# Patient Record
Sex: Female | Born: 1965 | Race: White | Hispanic: No | Marital: Married | Smoking: Former smoker
Health system: Southern US, Community
[De-identification: ages and names within clinical notes are randomized; demographics above are authoritative.]

## PROBLEM LIST (undated history)

## (undated) DIAGNOSIS — F329 Major depressive disorder, single episode, unspecified: Secondary | ICD-10-CM

## (undated) DIAGNOSIS — R51 Headache: Secondary | ICD-10-CM

## (undated) DIAGNOSIS — F419 Anxiety disorder, unspecified: Secondary | ICD-10-CM

## (undated) DIAGNOSIS — M5126 Other intervertebral disc displacement, lumbar region: Secondary | ICD-10-CM

## (undated) DIAGNOSIS — K219 Gastro-esophageal reflux disease without esophagitis: Secondary | ICD-10-CM

## (undated) DIAGNOSIS — F32A Depression, unspecified: Secondary | ICD-10-CM

## (undated) DIAGNOSIS — M1990 Unspecified osteoarthritis, unspecified site: Secondary | ICD-10-CM

## (undated) DIAGNOSIS — R519 Headache, unspecified: Secondary | ICD-10-CM

## (undated) HISTORY — PX: CHOLECYSTECTOMY: SHX55

## (undated) HISTORY — PX: PARTIAL HYSTERECTOMY: SHX80

## (undated) HISTORY — PX: OTHER SURGICAL HISTORY: SHX169

## (undated) HISTORY — PX: BREAST REDUCTION SURGERY: SHX8

## (undated) HISTORY — DX: Gastro-esophageal reflux disease without esophagitis: K21.9

---

## 2005-06-26 ENCOUNTER — Ambulatory Visit: Payer: Self-pay | Admitting: Internal Medicine

## 2005-07-05 ENCOUNTER — Ambulatory Visit (HOSPITAL_COMMUNITY): Admission: RE | Admit: 2005-07-05 | Discharge: 2005-07-05 | Payer: Self-pay | Admitting: Internal Medicine

## 2005-07-05 ENCOUNTER — Ambulatory Visit: Payer: Self-pay | Admitting: Internal Medicine

## 2005-07-08 ENCOUNTER — Ambulatory Visit (HOSPITAL_COMMUNITY): Admission: RE | Admit: 2005-07-08 | Discharge: 2005-07-08 | Payer: Self-pay | Admitting: Internal Medicine

## 2005-07-08 IMAGING — US US ABDOMEN COMPLETE
1 series · 14 of 25 positions shown · non-contrast
Comparison: none

CLINICAL DATA: Right upper quadrant pain.  GERD.  Epigastric pain and pressure.  Hiatal hernia.
 ABDOMEN ULTRASOUND COMPLETE:
TECHNIQUE: Complete abdominal ultrasound examination was performed including evaluation of the liver, gallbladder, bile ducts, pancreas, kidneys, spleen, IVC, and abdominal aorta.

[Series 1: unknown · 0.34mm/px · 14 of 62 slices shown]
[im 1/62]
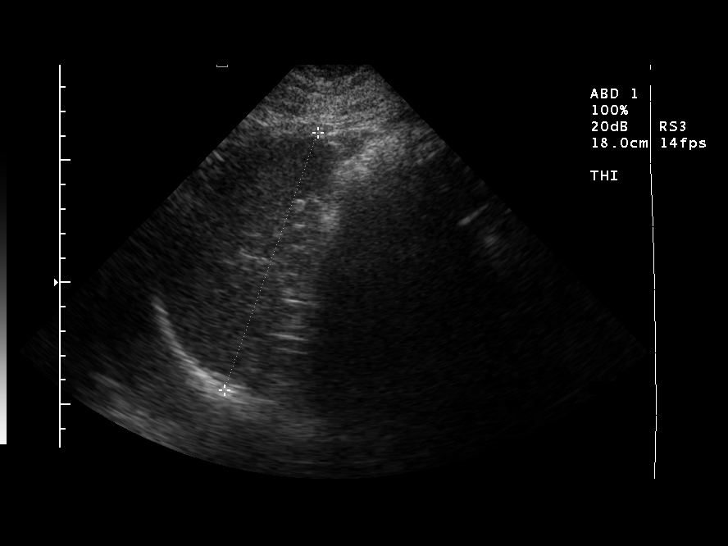
[im 6/62]
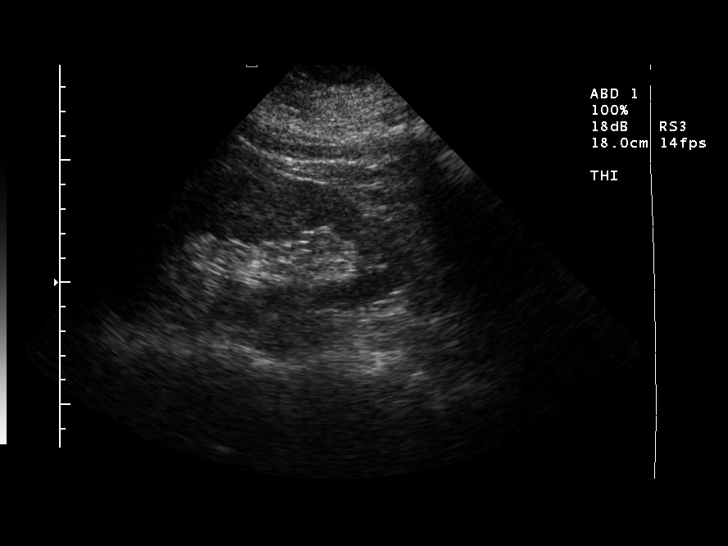
[im 11/62]
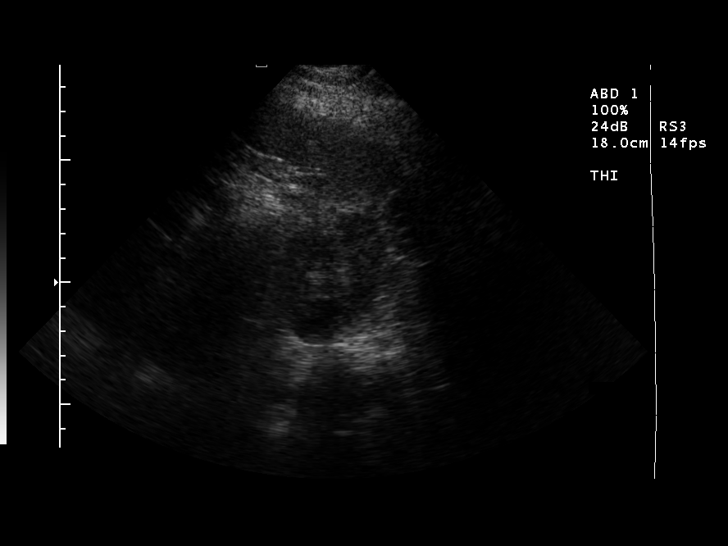
[im 16/62]
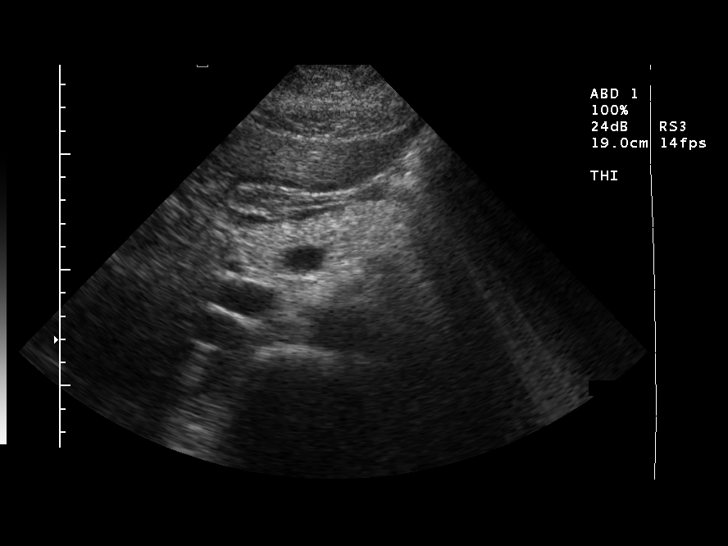
[im 21/62]
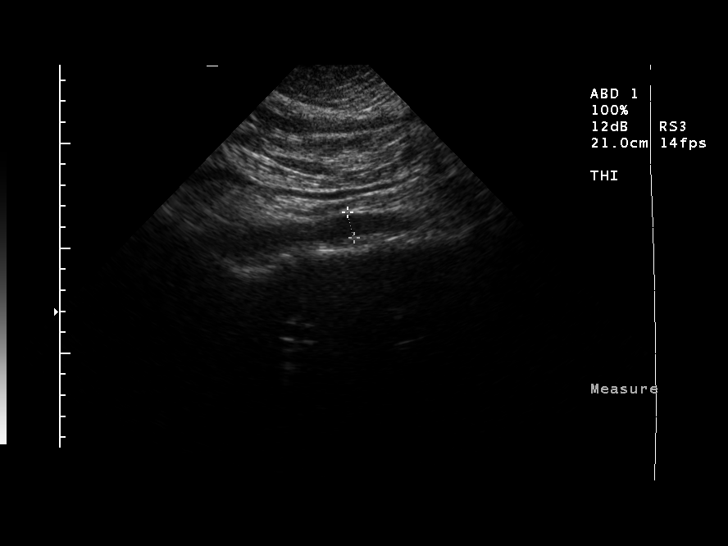
[im 23/62]
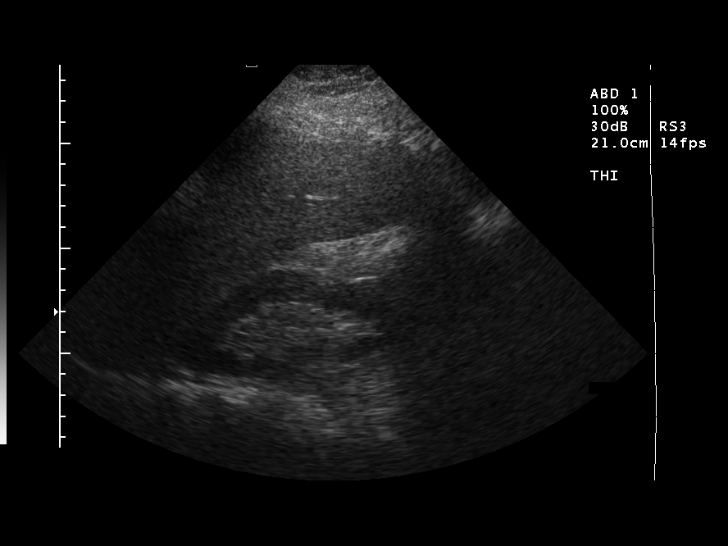
[im 28/62]
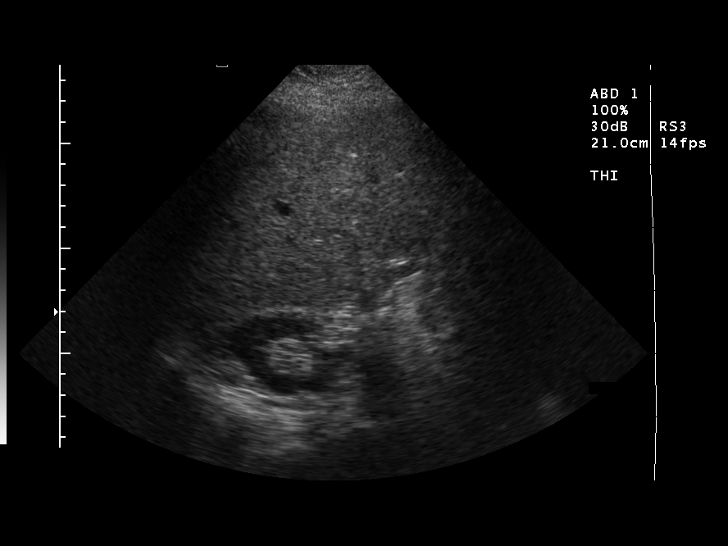
[im 34/62]
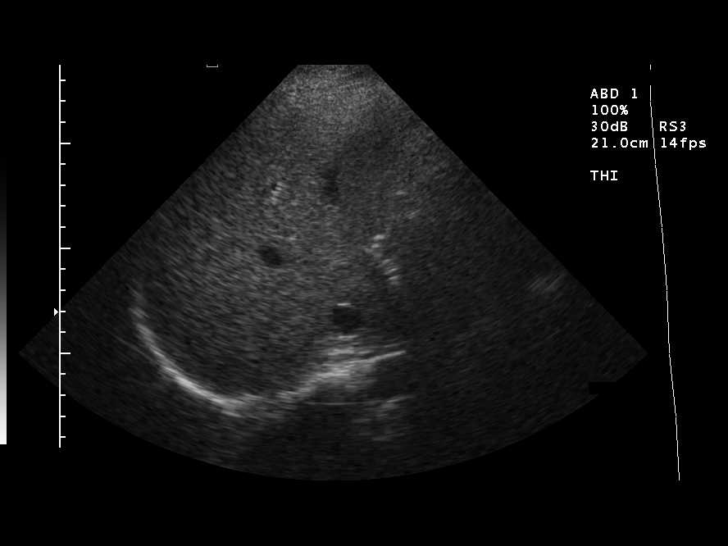
[im 39/62]
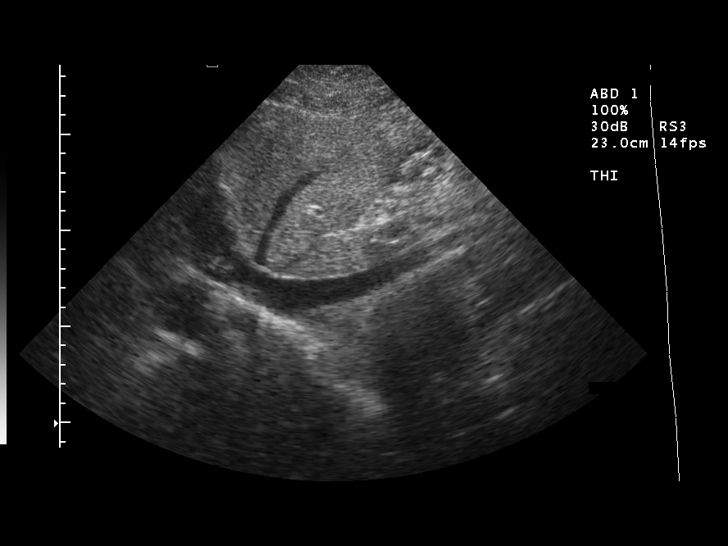
[im 41/62]
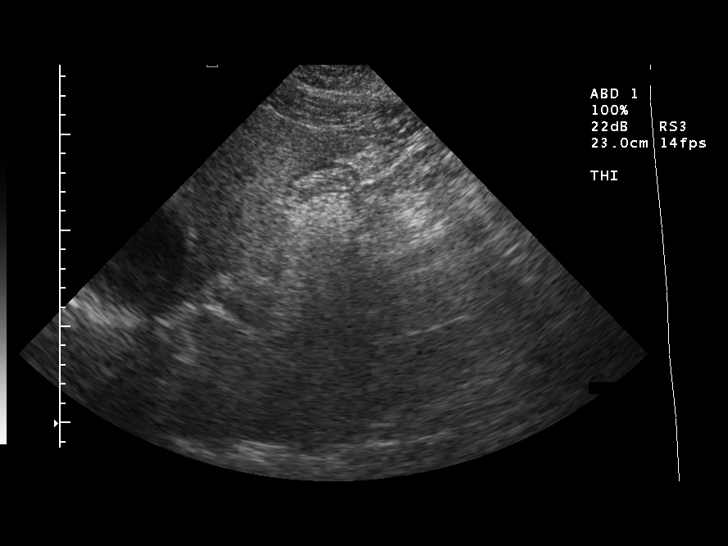
[im 46/62]
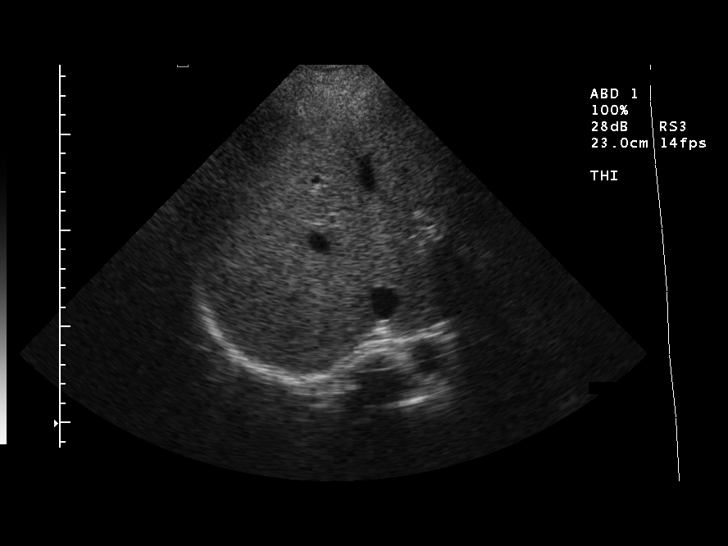
[im 51/62]
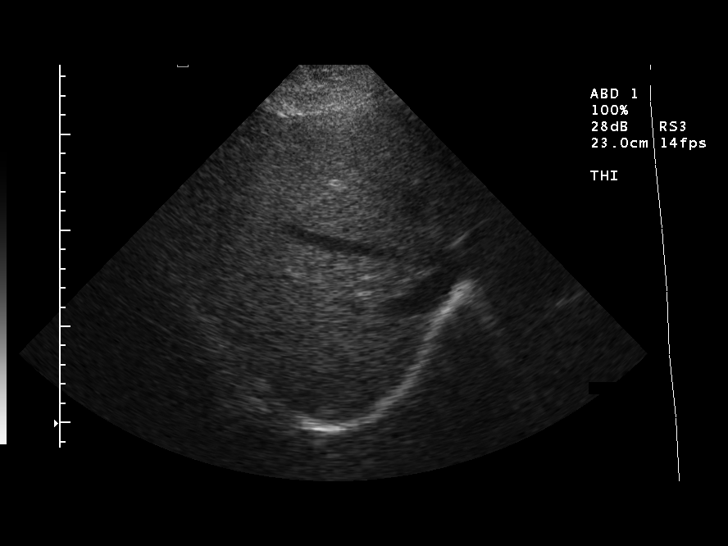
[im 56/62]
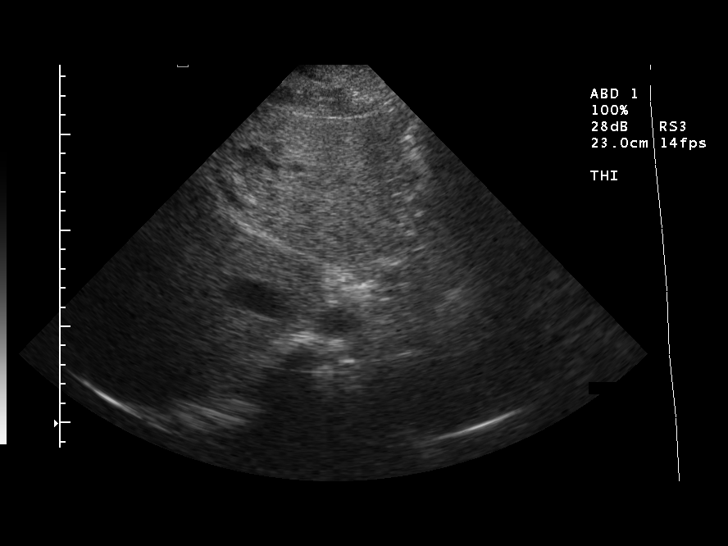
[im 62/62]
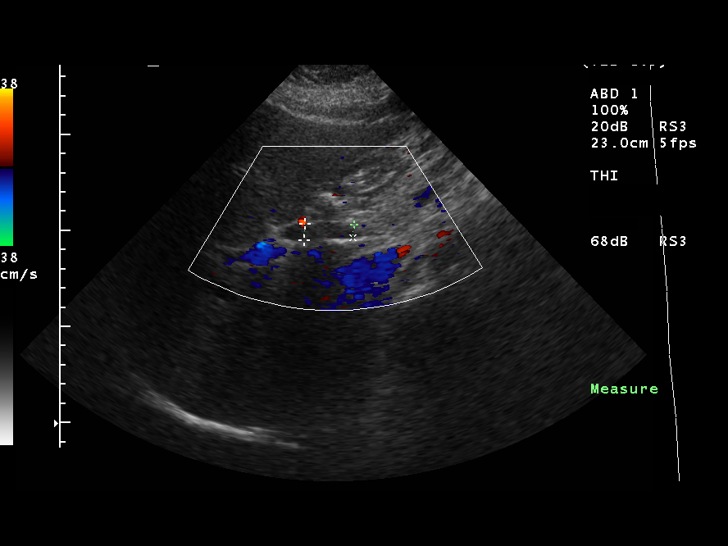

[14 of 25 positions shown; findings below may reference images not displayed]

FINDINGS: Multiple scans of the abdomen are made and show the gallbladder to have been removed.  The common bile duct measures a maximum of 8.5 mm with the distal measurement being 6.8 mm.  These are both within the normal limits for a post-cholecystectomy patient.  The gallbladder is said to have been removed eight years ago.  The liver shows some fatty infiltration but no focal masses.  The hepatic ducts appear normal.  The inferior vena cava and pancreas are normal.  The spleen is normal and measures 11.2 cm.  The right kidney is normal and measures 11.7 cm.  The left kidney is normal and measures 10.6 cm.  The abdominal aorta is normal and measures 2.4 cm.
IMPRESSION: Status post cholecystectomy.  The common bile duct measures a maximum of 8.5 mm with no filling defects.  The liver shows fatty infiltration but no focal mass.

## 2005-09-10 ENCOUNTER — Ambulatory Visit: Payer: Self-pay | Admitting: Internal Medicine

## 2007-11-05 ENCOUNTER — Ambulatory Visit: Payer: Self-pay | Admitting: Internal Medicine

## 2007-11-12 ENCOUNTER — Ambulatory Visit: Payer: Self-pay | Admitting: Internal Medicine

## 2007-11-12 ENCOUNTER — Ambulatory Visit (HOSPITAL_COMMUNITY): Admission: RE | Admit: 2007-11-12 | Discharge: 2007-11-12 | Payer: Self-pay | Admitting: Internal Medicine

## 2007-11-12 HISTORY — PX: ESOPHAGOGASTRODUODENOSCOPY: SHX1529

## 2007-11-12 HISTORY — PX: COLONOSCOPY: SHX174

## 2007-12-17 ENCOUNTER — Ambulatory Visit: Payer: Self-pay | Admitting: Internal Medicine

## 2008-06-17 ENCOUNTER — Telehealth (INDEPENDENT_AMBULATORY_CARE_PROVIDER_SITE_OTHER): Payer: Self-pay

## 2008-07-11 ENCOUNTER — Telehealth (INDEPENDENT_AMBULATORY_CARE_PROVIDER_SITE_OTHER): Payer: Self-pay | Admitting: *Deleted

## 2008-12-01 ENCOUNTER — Encounter (INDEPENDENT_AMBULATORY_CARE_PROVIDER_SITE_OTHER): Payer: Self-pay | Admitting: *Deleted

## 2008-12-21 ENCOUNTER — Telehealth (INDEPENDENT_AMBULATORY_CARE_PROVIDER_SITE_OTHER): Payer: Self-pay

## 2009-02-14 DIAGNOSIS — F329 Major depressive disorder, single episode, unspecified: Secondary | ICD-10-CM | POA: Insufficient documentation

## 2009-02-14 DIAGNOSIS — K219 Gastro-esophageal reflux disease without esophagitis: Secondary | ICD-10-CM

## 2009-02-14 DIAGNOSIS — K222 Esophageal obstruction: Secondary | ICD-10-CM

## 2009-02-14 DIAGNOSIS — K921 Melena: Secondary | ICD-10-CM

## 2009-02-14 DIAGNOSIS — E7800 Pure hypercholesterolemia, unspecified: Secondary | ICD-10-CM

## 2009-02-14 DIAGNOSIS — F411 Generalized anxiety disorder: Secondary | ICD-10-CM | POA: Insufficient documentation

## 2009-02-14 DIAGNOSIS — F3289 Other specified depressive episodes: Secondary | ICD-10-CM | POA: Insufficient documentation

## 2009-02-14 DIAGNOSIS — K649 Unspecified hemorrhoids: Secondary | ICD-10-CM | POA: Insufficient documentation

## 2009-02-14 DIAGNOSIS — K5900 Constipation, unspecified: Secondary | ICD-10-CM

## 2009-02-14 DIAGNOSIS — K449 Diaphragmatic hernia without obstruction or gangrene: Secondary | ICD-10-CM | POA: Insufficient documentation

## 2009-02-21 ENCOUNTER — Ambulatory Visit: Payer: Self-pay | Admitting: Internal Medicine

## 2009-02-22 DIAGNOSIS — K5730 Diverticulosis of large intestine without perforation or abscess without bleeding: Secondary | ICD-10-CM | POA: Insufficient documentation

## 2009-04-06 ENCOUNTER — Encounter: Payer: Self-pay | Admitting: Gastroenterology

## 2010-01-01 ENCOUNTER — Telehealth (INDEPENDENT_AMBULATORY_CARE_PROVIDER_SITE_OTHER): Payer: Self-pay

## 2010-03-06 ENCOUNTER — Encounter (INDEPENDENT_AMBULATORY_CARE_PROVIDER_SITE_OTHER): Payer: Self-pay | Admitting: *Deleted

## 2010-03-20 NOTE — Medication Information (Signed)
Summary: Tax adviser   Imported By: Diana Eves 04/06/2009 08:26:55  _____________________________________________________________________  External Attachment:    Type:   Image     Comment:   External Document  Appended Document: RX Folder - dexilant    Prescriptions: DEXILANT 60 MG CPDR (DEXLANSOPRAZOLE) one by mouth daily for acid reflux  #30 x 11   Entered and Authorized by:   Leanna Battles. Dixon Boos   Signed by:   Leanna Battles Amena Dockham PA-C on 04/06/2009   Method used:   [facility]        [id number]       [address]       [place], [place]  [address]       Ph: [phone]       Fax: [phone]   RxID:   [id number]

## 2010-03-20 NOTE — Progress Notes (Signed)
Summary: pt requests Nexium  Phone Note Call from Patient   Caller: Patient Summary of Call: Pt left VM that she has a new insurance co and they are asking that she be switched to Nexium instead of the Dexilant.  Rx [facility] to [facility]  (She can be reached @ work at [phone]) Initial call taken by: Cloria Spring LPN,  January 01, 2010 2:23 PM     Appended Document: pt requests Nexium    Prescriptions: NEXIUM 40 MG CPDR (ESOMEPRAZOLE MAGNESIUM) 1 by mouth daily for acid reflux  #31 x 11   Entered and Authorized by:   Joselyn Arrow FNP-BC   Signed by:   Joselyn Arrow FNP-BC on 01/02/2010   Method used:   Electronically to        [facility]       [address]       [place], [place]  [address]       Ph: [phone]       Fax: [phone]   RxID:   [id number]     Appended Document: pt requests Nexium pt [facility] and called pt and informed her.   Appended Document: pt requests Nexium pt called back, the nexium is 200.00 and she cant afford that either. pt is requesting something cheaper and a generic sent to [facility], [place]  Appended Document: pt requests Nexium  Please call pt.  Have her call her insurance company to find out preferred PPI.  When I look it up in computer, it lists them all as Off formulary or non-preferred.  Let me know.  We can give her samples in interim if she needs. Thanks     Appended Document: pt requests Nexium LMOM to call.  Appended Document: pt requests Nexium Pt informed. Said she will contact insurance co. Does not have anyone to pick up samples, and she would have to get off work early, which she declined at this time.

## 2010-03-20 NOTE — Assessment & Plan Note (Signed)
Summary: one year fu,acid reflux,rx is running out/ss   Visit Type:  Follow-up Visit Primary Care Provider:  [facility]  Chief Complaint:  F/U reflux.  History of Present Illness: History of GERD/ schatzki's ring; status post dilation previously. No evidence of Barrett's esophagus on prior EGD. Prior colonoscopy for hematochezia demonstrated hemorrhoids and left-sided diverticula. Constipation well-managed with MiraLax 1-2 times weekly.  Dexalant 60 mg orally daily has controlled her reflux symptoms and belching to a significant degree.  She sometimes has some belching relieved with sneezing; some intermittent nausea but  never vomits. She is lost 5 pounds since she was here last year.  Because a family history colon cancer and polyps,  she will be due for screening exam age 8 She is no longer having dysphagia since her Schatzki's ring was dilated in 2009.   Preventive Screening-Counseling & Management  Alcohol-Tobacco     Smoking Status: quit  Caffeine-Diet-Exercise     Does Patient Exercise: no  Current Medications (verified): 1)  Dexilant 60 Mg Cpdr (Dexlansoprazole) .... 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  Alcohol Use - no Patient does not get regular exercise.  Smoking Status:  quit Does Patient Exercise:  no  Vital Signs:  Patient profile:   45 year old female Height:      67 inches Weight:      216 pounds BMI:     33.95 Temp:     98.4 degrees F oral Pulse  rate:   60 / minute BP sitting:   100 / 64  (right arm) Cuff size:   large  Vitals Entered By: Cloria Spring LPN (February 21, 2009 9:29 AM)  Physical Exam  General:  Well developed, well nourished, no acute distress. Eyes:  no scleral icterus per Dr. pink Abdomen:  nondistended positive bowel sounds somewhat obese soft nontender without appreciable mass or organomegaly  Impression & Recommendations: Impression: Pleasant 45 year old lady with gastroesophageal reflux disease fairly well controlled on Dexalant 60 mg orally daily.  She has occasional nausea and belching but much improved over that previously. Antireflux lifestyle/diet recommendations.   May take  before supper dose of Dexalant on occasion if needed. History of diverticulosis on prior colonoscopy; positive family of polyps and colon cancer.  Constipation will manage with MiraLax as needed.  She is to continue that regimen. We'll plan to see her back in one year for a recheck. We'll also slate her for a repeat colonoscopy at age 22. The      Appended Document: Orders Update-charge    Clinical Lists Changes  Problems: Added new problem of DIVERTICULOSIS OF COLON (ICD-562.10) Orders: Added new Service order of Est. Patient Level III ([address]) - Signed

## 2010-03-22 NOTE — Letter (Signed)
Summary: Recall Office Visit  [facility]  [address]   [place], [place] [address]   Phone: [phone]  Fax: [phone]      March 06, 2010   Jeanne Fisher [address] [place], [place]  [address] Sep 20, 1965   Dear Ms. Faw,   According to our records, it is time for you to schedule a follow-up office visit with [place].   At your convenience, please call [phone] to schedule an office visit. If you have any questions, concerns, or feel that this letter is in error, we would appreciate your call.   Sincerely,    Diana Eves  [facility] Ph: [phone]   Fax: [phone]

## 2010-04-11 ENCOUNTER — Encounter: Payer: Self-pay | Admitting: Gastroenterology

## 2010-04-11 ENCOUNTER — Ambulatory Visit (INDEPENDENT_AMBULATORY_CARE_PROVIDER_SITE_OTHER): Payer: PRIVATE HEALTH INSURANCE | Admitting: Gastroenterology

## 2010-04-11 DIAGNOSIS — K649 Unspecified hemorrhoids: Secondary | ICD-10-CM

## 2010-04-17 NOTE — Assessment & Plan Note (Signed)
Summary: SEVERE ABD PAIN/UQP AFTER SHE EATS/LAW   Vital Signs:  Patient profile:   45 year old female Height:      67.5 inches Weight:      207 pounds BMI:     32.06 Temp:     99 degrees F oral Pulse rate:   88 / minute BP sitting:   122 / 82  (right arm)  Vitals Entered By: Carolan Clines LPN (April 11, 2010 1:27 PM)  Visit Type:  Follow-up Visit Primary Care Provider:  [facility]   History of Present Illness: Ms. Brabson is a 45 year old pleasant Caucasian female who presents today for follow-up for chronic GERD. She was last seen in Jan 2011, presribed Dexilant, but she has had major issues with insurance coverage. She states they will only cover generic medications. Has not been on a PPI in 2 mos. Does have intermittent reflux, +nocturnal reflux, and for last 2 weeks has had sharp epigastric pain after eating. No nausea. 6/10. No dysphagia/odynophagia. Has been taking aleve sparingly for arthritis.  In past has taken Nexium and Protonix, both of which were helpful.   Allergies (verified): 1)  ! 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  Alcohol Use -occasional Patient does not get regular exercise.   Review of Systems General:  Denies fever, chills, and anorexia. Eyes:  Denies blurring, irritation, and discharge. ENT:  Denies sore throat, hoarseness, and difficulty swallowing. CV:  Denies chest pains and syncope. Resp:  Denies dyspnea at rest and wheezing. GI:  See  HPI. GU:  Denies urinary burning and urinary frequency. MS:  Denies joint pain / LOM, joint swelling, and joint stiffness. Derm:  Denies rash, itching, and dry skin. Neuro:  Denies weakness and syncope. Psych:  Denies depression and anxiety. Endo:  Denies cold intolerance and heat intolerance. Heme:  Denies bruising and bleeding.  Physical Exam  General:  Well developed, well nourished, no acute distress. Head:  Normocephalic and atraumatic. Mouth:  No deformity or lesions, dentition normal. Lungs:  Clear throughout to auscultation. Heart:  Regular rate and rhythm; no murmurs, rubs,  or bruits. Abdomen:  +BS, soft, mild tenderness epigastric region, non-distended. no rebound or guarding. no HSM Msk:  Symmetrical with no gross deformities. Normal posture. Extremities:  No clubbing, cyanosis, edema or deformities noted. Neurologic:  Alert and  oriented x4;  grossly normal neurologically. Skin:  Intact without significant lesions or rashes. Psych:  Alert and cooperative. Normal mood and affect.   Impression & Recommendations:  Problem # 1:  GERD (ICD-66.53) 45 year old pleasant Caucasian female with hx of chronic GERD, now with increased symptoms secondary to lack of PPI. Insurance covers only generic. Has done well with Nexium and Protonix in past. Does report +epigastric discomfort with eating for past 2 weeks, takes aleve sparingly for arthritis. No nausea, no dysphagia, no odynophagia. Likely due to cessation of PPI, will trial generic Protonix and get PR in 10-14 days.  Protonix (generic )faxed to pharmacy Avoid NSAIDs Contact us in 10-14 days with progress report  Otherwise, F/U in 3 mos  Other Orders: Est. Patient Level II (60[address]) Prescriptions: PANTOPRAZOLE SODIUM 40 MG TBEC (PANTOPRAZOLE SODIUM) take 1 by mouth 30 minutes before breakfast daily  #31 x 3   Entered and Authorized by:   Gerrit Halls NP   Signed by:   Gerrit Halls NP on 04/11/2010   Method used:   F[facility]ed to ...        [facility]       [address]       [place], [place]  [address]       Ph: [phone]       Fax: [phone]   RxID:   [id number]    Orders Added: 1)  Est. Patient Level II [27253]  Appended Document: SEVERE ABD PAIN/UQP AFTER SHE EATS/LAW 3 MONTH F/U OPV IS IN THE COMPUTER

## 2010-06-19 ENCOUNTER — Ambulatory Visit: Payer: PRIVATE HEALTH INSURANCE | Admitting: Gastroenterology

## 2010-07-03 NOTE — Op Note (Signed)
Jeanne Fisher, Jeanne Fisher               ACCOUNT NO.:  [id number]   MEDICAL RECORD NO.:  [id number]          PATIENT TYPE:  AMB   LOCATION:  DAY                           FACILITY:  [facility]   PHYSICIAN:  R. Roetta Sessions, M.D. DATE OF BIRTH:  1965/12/21   DATE OF PROCEDURE:  11/12/2007  DATE OF DISCHARGE:                               OPERATIVE REPORT   INDICATIONS FOR PROCEDURE:  A 45 year old lady with acute on chronic  constipation, intermittent hematochezia, family history of polyps, and  colorectal cancer.  Never had her lower GI tract imaged.  She also has  longstanding gastroesophageal reflux disease with possible LPR component  and describes intermittent dysphagia to pills and solid food.  She had  been on Protonix 40 mg orally daily, we recommended to switch over to  Kapidex 60 mg orally daily.  EGD with possible esophageal dilation.  Colonoscopy is now being done.  Risks, benefits, alternatives, and  limitations have been reviewed, questions answered.  Please see the  documentation in the medical record.   PROCEDURE NOTE:  O2 saturation, blood pressure, pulse, and respirations  were monitored throughout the entirety of procedure.   CONSCIOUS SEDATION:  Versed 6 mg IV, Demerol 150 mg IV in divided doses,  Phenergan 12.5 mg diluted slow IV push to augment conscious sedation.  Cetacaine spray for topical oropharyngeal anesthesia.   INSTRUMENT:  Pentax video chip system.   FINDINGS:  Examination of tubular esophagus revealed a subtle Schatzki  ring, otherwise esophageal mucosa appeared unremarkable.  EG junction  easily traversed.  Stomach:  Gastric cavity was empty, insufflated well  with air.  Thorough examination of the gastric mucosa including  retroflexed view of the proximal stomach esophagogastric junction  demonstrated small hiatal hernia.  Gastric mucosa otherwise appeared  normal.  Pylorus was patent, easily traversed.  Examination of the bulb  second portion revealed no  abnormalities.   THERAPEUTIC/DIAGNOSTIC MANEUVERS PERFORMED:  A 54-French Maloney dilator  was passed, full insertion with ease.  Look back revealed no apparent  complications relating to passage of the dilator.  The patient tolerated  the procedure well, prepared for colonoscopy.  Digital rectal exam  revealed no abnormalities.   ENDOSCOPIC FINDINGS:  Prep was adequate.  Colon:  Colonic mucosa was  surveyed from the rectosigmoid junction through the left transverse,  right colon, appendiceal orifice, ileocecal valve, and cecum.  These  structures were well seen and photographed for the record.  Terminal  ileum was intubated 10 cm.  From this level, scope was slowly and  cautiously withdrawn.  All previous mentioned mucosal surfaces were  again seen.  The patient had few sigmoid diverticula and the remainder  colonic mucosa and terminal ileal mucosa appeared normal.  Scope was  pulled down the rectum.  Thorough examination of the rectal mucosa  including retroflexion of the distal rectum and on fos view of the anal  canal demonstrated only some internal hemorrhoids.  The patient  tolerated both procedures well, and was reacted in Endoscopy.   IMPRESSION:  1. Subtle Schatzki ring, otherwise unremarkable esophagus status post  Maloney dilation.  2. Small hiatal hernia, otherwise normal stomach, D1 and D2,      colonoscopy findings, internal hemorrhoids, otherwise normal      rectum, sigmoid diverticula, colonic mucosa, terminal ileal mucosa      appeared normal.   RECOMMENDATIONS:  1. Kapidex 60 mg orally daily.  Antireflux literature provided to Ms.      Tidmore, prescription for Kapidex provided.  2. Hemorrhoid literature as well as constipation and diverticulosis      literature provided to Ms. Shankles.  3. Benefiber 1 tablespoon daily.  4. Begin Amitiza 8 mcg, 1 joule cap with breakfast and supper daily,      prescription given, warned about side effects of nausea, headache,       and diarrhea.  5. Ten-day course of Anusol-HC Suppositories 1 per rectum at bedtime.  6. Follow up appointment with [place] in 1 month.      Jonathon Bellows, M.D.  Electronically Signed     RMR/MEDQ  D:  11/12/2007  T:  11/13/2007  Job:  [id number]   cc:   Dr. Rosey Bath  [place]

## 2010-07-03 NOTE — Assessment & Plan Note (Signed)
NAMEMARCUS, GROLL                CHART#:  [id number]   DATE:  12/17/2007                       DOB:  05-Aug-1965   FOLLOWUP:  Constipation and gastroesophageal reflux disease.  Last seen  on 11/12/2007 at which time, she underwent EGD and colonoscopy.  She was  found to have a small hiatal hernia and Schatzki ring, which was dilated  with a 54-French Maloney dilator.  Colonoscopy demonstrated internal  hemorrhoids and sigmoid diverticula.  The remainder of the colonic  mucosa and terminal ileum mucosa appeared normal.  She has done well on  Amitiza 8 mcg twice daily and Kapidex 60 mg orally daily.  She has for  something less expensive than Amitiza.  She has not been tried on  MiraLax.  She is having no further rectal bleeding.  She has a positive  family history of polyps and colon cancer.   CURRENT MEDICATIONS:  See updated list.   ALLERGIES:  No known drug allergies.   PHYSICAL EXAMINATION:  GENERAL:  Today, she appears well.  VITAL SIGNS:  Weight 221, height 5 feet 7 inches, temperature 98.4, BP  104/76, and pulse 72.  ABDOMEN:  Flat, positive bowel sounds, soft, nontender without  appreciable mass or organomegaly.   ASSESSMENT:  1. Hematochezia secondary to hemorrhoids.  She has diverticulosis on      recent colonoscopy.  Chronic constipation.  Okay for her to stop      Amitiza.  She __________ on MiraLax 17 g orally daily to twice      daily, titrate to at least 3 bowel movements weekly or 1 every      other day.  2. Colonoscopy for screening purposes at age 45.  3. Multi-pronged approach for gastroesophageal reflux disease reviewed      including weight loss.  Continue Kapidex 60 mg each morning.      Unless something comes up, I will plan to see this nice lady back      in 1 year.  4. Dysphagia resolved, status post passage of a Maloney dilator.       Jonathon Bellows, M.D.  Electronically Signed    RMR/MEDQ  D:  12/17/2007  T:  12/17/2007  Job:  [id number]   cc:   Dr. Reather Converse

## 2010-07-03 NOTE — H&P (Signed)
Jeanne Fisher, Jeanne Fisher               ACCOUNT NO.:  [id number]   MEDICAL RECORD NO.:  [id number]          PATIENT TYPE:  AMB   LOCATION:  DAY                           FACILITY:  [facility]   PHYSICIAN:  R. Roetta Sessions, M.D. DATE OF BIRTH:  1965-10-19   DATE OF ADMISSION:  DATE OF DISCHARGE:  LH                              HISTORY & PHYSICAL   CHIEF COMPLAINT:  Constipation, nausea, belching, and reflux.   HISTORY OF PRESENT ILLNESS:  The patient is a 45 year old lady who  presents today for further evaluation of change in bowel movements with  worsening constipation, nausea, belching, and reflux which is poorly  controlled.  She has been seen in our practice previously for her  reflux.  She had EGD in May 2007, which revealed a small hiatal hernia  and multiple antral erosions.  H. pylori serologies were negative.  She  states that over the last 4 months, she has had worsening of  constipation.  She has always had chronic constipation.  She has been  self treating with adding fiber tablets 6 daily.  This did not work so  she tried Gaffer.  Now she uses Correctol when she is completely  miserable.  She will go many days without a bowel movement and then  becomes uncomfortable and takes Correctol with results.  She states she  never has a bowel movement without laxatives at this point.  She is  quite concerned.  She also has hematochezia.  She notes that her  maternal grandmother died with metastatic colon cancer at age 78.  Her  mother was diagnosed with colon polyps in her 7s.  She also complains  of chronic GERD.  She is on Protonix 40 mg daily and having breakthrough  symptoms especially at night time.  She is having nocturnal  regurgitation.  She feels like she has food stuck in her throat.  She  has some nausea and belching.  She notes that she saw Dr. Tamera Stands for  her chronic sore throat last year, and he felt she had LTR and treated  her first 6 weeks with double-dose PPI therapy  and she did improve with  the time.  She denies any specific abdominal pain.  She has had some  lower abdominal discomfort with the constipation.  Previously, she did  well on Nexium 40 mg daily, but her insurance will no longer cover it.   CURRENT MEDICATIONS:  1. Protonix 40 mg daily.  2. Cymbalta 60 mg daily.   ALLERGIES:  No known drug allergies.   PAST MEDICAL HISTORY:  Gastroesophageal reflux disease, diet control,  hypercholesterolemia, depression, anxiety, hysterectomy, cesarean  section, and cholecystectomy at age 50 with greater than 100 stones.   FAMILY HISTORY:  As above regarding colorectal cancer and polyps.  Her  maternal grandfather had esophageal cancer.  Paternal grandmother had  kidney cancer.  Father died at age 25 of MI.   SOCIAL HISTORY:  She is married with 1 child.  She works in Advice worker for [employer].  She quit smoking 5 years ago, started maybe  about 7  years ago.  No alcohol use.   REVIEW OF SYSTEMS:  See HPI for GI.  CONSTITUTIONAL:  No weight loss.  CARDIOPULMONARY:  No chest pain or shortness of breath.  GENITOURINARY:  No dysuria or hematuria.   PHYSICAL EXAMINATION:  VITAL SIGNS:  Weight 224, height 5 feet 7-1/2  inches, temp 98.7, blood pressure 128/90, and pulse 72.  GENERAL:  Pleasant, mildly obese, Caucasian female in no acute distress.  SKIN:  Warm and dry.  No jaundice.  HEENT:  Sclera nonicteric. Oropharyngeal mucosa moist and pink.  No  lesion, erythema, or exudate.  No lymphadenopathy or thyromegaly.  CHEST:  Lungs are clear to auscultation.  CARDIAC:  Regular rate and rhythm.  Normal S1 and S2.  No murmurs, rubs,  or gallops.  ABDOMEN:  Positive bowel sound.  Abdomen is obese but symmetrical and  soft.  She has mild lower abdominal tenderness with deep palpitation.  No rebound or guarding.  No organomegaly or masses.  No abdominal bruits  or hernia.  LOWER EXTREMITIES:  No edema.   IMPRESSION:  The patient is a 45 year old  lady who presents with the  change in bowel movements with worsening constipation requiring  laxatives.  She also has hematochezia.  Family history is significant  for colorectal cancer in a secondary-degree relative and colonic polyp  in first-degree relative.  She has never had a colonoscopy.  We will  recommend diagnostic colonoscopy at this time.  In addition, she has  refractory gastroesophageal reflux disease symptoms with history of LTR  with recurrence of chronic sore throat, dysphagia versus globus.  She is  on PPI therapy with refractory symptoms.  She may do better on a  different PPI.  The patient is requesting EGD, which is reasonable given  refractory symptoms and possible dysphagia.   PLAN:  1. EGD and colonoscopy in near future with Dr. Jena Gauss.  I discussed      risks, alternative, and benefits with regards to, but not limited      to, the reaction with medications, bleeding, infection, and      perforation, and the patient is agreeable to proceed.  2. Trial of a different PPI such as Kapidex 60 mg daily 30 minutes      before meals.  Samples to be provided.      Tana Coast, P.AJonathon Bellows, M.D.  Electronically Signed    LL/MEDQ  D:  11/05/2007  T:  11/06/2007  Job:  [id number]   cc:   Dr. __________

## 2010-07-06 NOTE — Op Note (Signed)
NAMELEWIS, GRIVAS               ACCOUNT NO.:  [id number]   MEDICAL RECORD NO.:  [id number]          PATIENT TYPE:  AMB   LOCATION:  DAY                           FACILITY:  [facility]   PHYSICIAN:  R. Roetta Sessions, M.D. DATE OF BIRTH:  [birth year]   DATE OF PROCEDURE:  07/05/2005  DATE OF DISCHARGE:                                 OPERATIVE REPORT   PROCEDURE:  Diagnostic esophagogastroduodenoscopy.   INDICATIONS FOR PROCEDURE:  Ms. Jeanne Fisher is a 45 year old lady with  chronic gastroesophageal reflux disease and significant weight gain over the  past few years who complains of epigastric discomfort radiating up into her  chest. No odynophagia or dysphagia. The gallbladder was removed. She tells  [place] she had over 100 stones in her gallbladder. She was started on Protonix  on Jun 26, 2005 and has taken it daily and has not noticed any improvement in  her symptoms. EGD is now being done. This approach has been discussed with  the patient at length. The potential risks, benefits, and alternatives have  been reviewed, questions answered. She is agreeable. Please see  documentation in medical record.   MONITORING:  O2 saturation, blood pressure, pulse, and respirations were  monitored throughout the entire procedure.   CONSCIOUS SEDATION:  Versed 4 mg IV, Demerol 75 mg IV in divided doses.   INSTRUMENTS:  Olympus video chip system.   FINDINGS:  Examination of the tubular esophagus revealed no mucosal  abnormalities. The EG junction was easily traversed.   STOMACH:  The gastric cavity was empty and insufflated well with air. A  thorough examination of the gastric mucosa including a retroflexed view of  the proximal stomach and esophagogastric junction demonstrated only a small  hiatal hernia, multiple antral erosions otherwise gastric mucosa appeared  normal. The pylorus was patent and easily traversed. Examination of the bulb  and second portion revealed no abnormalities.   THERAPEUTIC/DIAGNOSTIC MANEUVERS:  None.   The patient tolerated the procedure well and was reacted in endoscopy.   IMPRESSION:  1.  Normal esophagus.  2.  Small hiatal hernia.  3.  Multiple antral erosions otherwise normal stomach. Patent pylorus,      normal D1, D2. Today's findings do not explain the patient's symptoms.   RECOMMENDATIONS:  1.  Continue Protonix 40 mg orally daily.  2.  Will proceed a ultrasound of the right upper quadrant, check bile duct      diameter and look for signs of a retained stones. Also check amylase,      lipase, hepatic profile, and H. pylori serology today.  3.  Further recommendations to follow.      Jonathon Bellows, M.D.  Electronically Signed     RMR/MEDQ  D:  07/05/2005  T:  07/05/2005  Job:  [id number]

## 2010-07-06 NOTE — H&P (Signed)
[name], [name]               ACCOUNT NO.:  [id number]   MEDICAL RECORD NO.:  [id number]          PATIENT TYPE:   LOCATION:                                 FACILITY:   PHYSICIAN:  R. Roetta Sessions, M.D. DATE OF BIRTH:  1965-05-06   DATE OF ADMISSION:  DATE OF DISCHARGE:  LH                                HISTORY & PHYSICAL   HISTORY OF PRESENT ILLNESS:  Jeanne Fisher is a 45 year old Caucasian female who  presents as a self referral for further evaluation of gastroesophageal  reflux disease and gastric discomfort.  She says she has had acid reflux for  more than 5 years.  She was on Nexium for a while but has been off this for  a couple of years.  She has been trying to control her symptoms with diet.  She admits to about a 40-pound weight gain in the last 5 years after  quitting smoking.  Her primary reflux symptoms consist of nocturnal acid  regurgitation which wakes her up at night.  She does have intermittent  heartburn.  The last couple of weeks, however, she has had severe heartburn  that is not responding to any medications.  She has tried OTC H2 blockers  high dose, as well as Tums and Mylanta with little relief.  She feels like  her stomach is up in her chest.  Symptoms are worse postprandially.  Denies  any dysphagia, odynophagia.  She has had nausea but no vomiting.  Bowel  movements are every-other day.  No melena or rectal bleeding.   CURRENT MEDICATIONS:  Advicor 20/500 mg daily.  Aspirin 81 mg daily.   ALLERGIES:  No known drug allergies.   PAST MEDICAL HISTORY:  1.  Gastroesophageal reflux disease.  2.  Hypercholesterolemia.  3.  Depression/anxiety.   PAST SURGICAL HISTORY:  1.  Hysterectomy.  2.  Cesarean section.  3.  Cholecystectomy age 58 with greater than 100 stones.   FAMILY HISTORY:  Mother has heart disease.  Father died of MI age 20.  Maternal grandmother died at age 87 of colon cancer.  Maternal grandfather  had esophageal cancer.  Paternal  grandmother had kidney cancer.   SOCIAL HISTORY:  She is married and has one child.  She works in Advice worker for [employer].  Quit smoking 5 years ago.  No alcohol use.   REVIEW OF SYSTEMS:  See HPI for GI.  CONSTITUTIONAL:  See HPI.  CARDIOPULMONARY:  No chest pain or shortness of breath.   PHYSICAL EXAMINATION:  VITAL SIGNS:  Weight 225, height 5 feet 7-and-a-half  inches, temperature 98.8, BP 124/82, pulse 74.  GENERAL:  Pleasant, moderately-obese Caucasian female in no acute distress.  SKIN:  Warm and dry, no jaundice.  HEENT:  Pupils equal, round, and reactive to light.  Conjunctivae are pink,  sclerae nonicteric.  Oropharyngeal mucosa moist and pink.  No lesions,  erythema, or exudate.  No lymphadenopathy or thyromegaly.  CHEST:  Lungs are clear to auscultation.  CARDIAC:  Reveals regular rate and rhythm, normal S1, S2.  No murmurs, rubs,  or gallops.  ABDOMEN:  Positive bowel sounds, obese but symmetrical, soft.  Minimal right  lower quadrant tenderness to deep palpation.  No organomegaly or masses.  No  rebound tenderness or guarding.  No abdominal bruits or hernias.  EXTREMITIES:  No edema.   IMPRESSION:  Jeanne Fisher is a 45 year old lady with chronic gastroesophageal  reflux disease of over 5 years' duration.  She has not been on a PPI in over  2 years and has been controlling her symptoms with diet.  The last few weeks  she has had a flare-up of her reflux and symptoms are not responding to OTC  agents.  She also complains of epigastric discomfort, sensation of her  stomach in her chest.  Given her chronic gastroesophageal reflux disease  she needs to have an EGD for surveillance purposes to rule out complications  of GERD.  However, because of her recent refractory symptoms she needs  diagnostic exam.  I have discussed risks, alternatives, benefits with the  patient and she is agreeable to proceed.   PLAN:  1.  EGD in the near future.  2.  Begin Protonix 40 mg daily,  #30 samples.  3.  Hold aspirin 3 days prior to procedure.      Tana Coast, P.AJonathon Bellows, M.D.  Electronically Signed    LL/MEDQ  D:  06/26/2005  T:  06/26/2005  Job:  [id number]

## 2010-09-03 ENCOUNTER — Other Ambulatory Visit: Payer: Self-pay | Admitting: Gastroenterology

## 2011-09-18 ENCOUNTER — Telehealth: Payer: Self-pay | Admitting: *Deleted

## 2011-09-18 MED ORDER — PANTOPRAZOLE SODIUM 40 MG PO TBEC: 40.0000 mg | DELAYED_RELEASE_TABLET | Freq: Every day | ORAL | Status: DC

## 2011-09-18 NOTE — Telephone Encounter (Signed)
done

## 2011-09-18 NOTE — Telephone Encounter (Signed)
Jeanne Fisher called today to make a follow up appt. She is in need of a refill for her protonix.  Her appointment is 8/8. Thanks.

## 2011-09-25 ENCOUNTER — Encounter: Payer: Self-pay | Admitting: Internal Medicine

## 2011-09-26 ENCOUNTER — Ambulatory Visit (INDEPENDENT_AMBULATORY_CARE_PROVIDER_SITE_OTHER): Payer: PRIVATE HEALTH INSURANCE | Admitting: Urgent Care

## 2011-09-26 ENCOUNTER — Encounter: Payer: Self-pay | Admitting: Urgent Care

## 2011-09-26 VITALS — BP 118/66 | HR 79 | Temp 97.5°F | Ht 67.5 in | Wt 230.6 lb

## 2011-09-26 DIAGNOSIS — E669 Obesity, unspecified: Secondary | ICD-10-CM

## 2011-09-26 DIAGNOSIS — Z8719 Personal history of other diseases of the digestive system: Secondary | ICD-10-CM

## 2011-09-26 DIAGNOSIS — K219 Gastro-esophageal reflux disease without esophagitis: Secondary | ICD-10-CM

## 2011-09-26 MED ORDER — PANTOPRAZOLE SODIUM 40 MG PO TBEC: 40.0000 mg | DELAYED_RELEASE_TABLET | Freq: Every day | ORAL | Status: DC

## 2011-09-26 NOTE — Assessment & Plan Note (Signed)
Continue when necessary stool softeners.

## 2011-09-26 NOTE — Progress Notes (Signed)
Faxed to PCP
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
## 2011-09-26 NOTE — Assessment & Plan Note (Signed)
Low fat/cholesterol diet. Gradually increase exercise up to 1 hr per day 5 days/week. Begin weight training to build muscle. Limit alcohol use. Call if you decide you would like referral to dietitian.

## 2011-09-26 NOTE — Patient Instructions (Addendum)
Continue protonix 40mg  daily 6 small meals per day Recommend 1-2# weight loss per week until ideal body weight through exercise & diet. Low fat/cholesterol diet. Gradually increase exercise up to 1 hr per day 5 days/week. Begin weight training to build muscle. Limit alcohol use. Office visit in 2 yrs or sooner if needed

## 2011-09-26 NOTE — Assessment & Plan Note (Addendum)
Jeanne Fisher is a pleasant 46 y.o. female with chronic GERD well controlled on Protonix 40 mg daily.  Continue protonix 40mg  daily 6 small meals per day Recommend 1-2# weight loss per week until ideal body weight through exercise & diet. Office visit in 2 yrs or sooner if needed

## 2011-11-21 ENCOUNTER — Other Ambulatory Visit: Payer: Self-pay | Admitting: Gastroenterology

## 2012-02-14 ENCOUNTER — Telehealth: Payer: Self-pay | Admitting: Internal Medicine

## 2012-02-14 NOTE — Telephone Encounter (Signed)
Patient is asking for Suppositories that Dr. Jena Gauss called in for her a year ago called to her Pharmacy

## 2012-02-17 ENCOUNTER — Telehealth: Payer: Self-pay | Admitting: Urgent Care

## 2012-02-17 MED ORDER — HYDROCORTISONE ACETATE 25 MG RE SUPP
25.0000 mg | Freq: Two times a day (BID) | RECTAL | Status: DC
Start: 2012-02-17 — End: 2012-09-21

## 2012-02-17 NOTE — Telephone Encounter (Signed)
Pt called this morning. She said she had called on Friday to see if [facility] would call in her Suppository RX. JL is aware and was rerouting previous message to refill box. Pt said she uses [facility] in [place] and their number is [phone]

## 2012-02-17 NOTE — Addendum Note (Signed)
Addended by: Joselyn Arrow on: 02/17/2012 11:19 AM   Modules accepted: Orders

## 2012-02-17 NOTE — Telephone Encounter (Signed)
Done

## 2012-02-17 NOTE — Telephone Encounter (Signed)
Rx sent 

## 2012-09-21 ENCOUNTER — Other Ambulatory Visit: Payer: Self-pay | Admitting: Urgent Care

## 2012-12-09 ENCOUNTER — Other Ambulatory Visit: Payer: Self-pay | Admitting: Gastroenterology

## 2013-02-22 ENCOUNTER — Other Ambulatory Visit: Payer: Self-pay

## 2013-02-22 MED ORDER — PANTOPRAZOLE SODIUM 40 MG PO TBEC: 40.0000 mg | DELAYED_RELEASE_TABLET | Freq: Every day | ORAL | Status: DC

## 2013-09-16 ENCOUNTER — Encounter: Payer: Self-pay | Admitting: Internal Medicine

## 2013-09-24 ENCOUNTER — Other Ambulatory Visit: Payer: Self-pay | Admitting: Gastroenterology

## 2013-09-28 ENCOUNTER — Encounter: Payer: Self-pay | Admitting: Internal Medicine

## 2013-09-28 ENCOUNTER — Other Ambulatory Visit: Payer: Self-pay

## 2013-11-03 ENCOUNTER — Other Ambulatory Visit: Payer: Self-pay

## 2013-11-03 ENCOUNTER — Encounter: Payer: Self-pay | Admitting: Gastroenterology

## 2013-11-03 ENCOUNTER — Ambulatory Visit (INDEPENDENT_AMBULATORY_CARE_PROVIDER_SITE_OTHER): Payer: No Typology Code available for payment source | Admitting: Gastroenterology

## 2013-11-03 VITALS — BP 116/68 | HR 99 | Temp 97.3°F | Ht 67.0 in | Wt 198.2 lb

## 2013-11-03 DIAGNOSIS — K649 Unspecified hemorrhoids: Secondary | ICD-10-CM

## 2013-11-03 DIAGNOSIS — Z8371 Family history of colonic polyps: Secondary | ICD-10-CM

## 2013-11-03 DIAGNOSIS — K219 Gastro-esophageal reflux disease without esophagitis: Secondary | ICD-10-CM

## 2013-11-03 DIAGNOSIS — Z8719 Personal history of other diseases of the digestive system: Secondary | ICD-10-CM

## 2013-11-03 DIAGNOSIS — Z800 Family history of malignant neoplasm of digestive organs: Secondary | ICD-10-CM

## 2013-11-03 MED ORDER — LINACLOTIDE 145 MCG PO CAPS: 145.0000 ug | ORAL_CAPSULE | Freq: Every day | ORAL | Status: DC

## 2013-11-03 MED ORDER — PEG 3350-KCL-NA BICARB-NACL 420 G PO SOLR: 4000.0000 mL | ORAL | Status: DC

## 2013-11-03 NOTE — Assessment & Plan Note (Signed)
Maternal grandmother died of colon cancer at age 50, shortly after diagnosis. Mother has had multiple precancerous polyps per patient. She was advised to have colonoscopy at age 80. No prior polyps on previous colonoscopy in 2009. Will augment conscious sedation with phenergan  IV 30 minutes before the procedure given prior history of conscious sedation needs.  I have discussed the risks, alternatives, benefits with regards to but not limited to the risk of reaction to medication, bleeding, infection, perforation and the patient is agreeable to proceed. Written consent to be obtained.

## 2013-11-03 NOTE — Progress Notes (Signed)
Primary Care Physician:  Maximiano CossHUNGARLAND,JOHN DAVID, MD  Primary Gastroenterologist:  Roetta SessionsMichael Rourk, MD   Chief Complaint  Patient presents with  . Colonoscopy    HPI:  Jeanne Fisher is a 48 y.o. female here to schedule colonoscopy. She has a family history of colon cancer, maternal grandmother, died age 50. Her mother also has had multiple colon polyps, precancerous per patient. Dr. Jena Gaussourk advised for her to have her next colonoscopy age 48. Last one in 2009 without polyps.  Patient is chronically constipated. She takes MiraLax every couple of days but is not like the taste or consistency. She would prefer another option. Past few weeks she's noted some lower abdominal pain discomfort which she feels is associated to constipation. Occasional flare with hemorrhoids, using suppositories as needed.  No melena, brbpr. Heartburn doing well. No significant dysphagia. No nausea/vomiting. She's been able to loose some weight over the past couple of years. She is down about 30 pounds.  Current Outpatient Prescriptions  Medication Sig Dispense Refill  . amitriptyline (ELAVIL) 25 MG tablet Take 25 mg by mouth at bedtime.      Marland Kitchen. aspirin 81 MG tablet Take 81 mg by mouth daily.      Marland Kitchen. docusate sodium (COLACE) 100 MG capsule Take 100 mg by mouth as needed.      . pantoprazole (PROTONIX) 40 MG tablet Take 1 tablet (40 mg total) by mouth daily.  90 tablet  3  .       .        No current facility-administered medications for this visit.    Allergies as of 11/03/2013  . (No Known Allergies)    Past Medical History  Diagnosis Date  . GERD (gastroesophageal reflux disease)   . Constipation     Past Surgical History  Procedure Laterality Date  . Partial hysterectomy    . Cesaren section    . Cholecystectomy    . Breast reduction surgery    . Ruptured discs    . Esophagogastroduodenoscopy  11/12/2007    Subtle Schatzki ring, otherwise unremarkable esophagus status post    Maloney dilation  .  Colonoscopy  11/12/2007    Small hiatal hernia, otherwise normal stomach/ , internal hemorrhoids, otherwise normal    Family History  Problem Relation Age of Onset  . Colon cancer Maternal Grandmother     age 50, deceased  . Colon polyps Mother   . Esophageal cancer Maternal Grandfather   . Heart attack Father     died age 50  . Kidney cancer Paternal Grandmother     History   Social History  . Marital Status: Married    Spouse Name: N/A    Number of Children: 1  . Years of Education: N/A   Occupational History  . Not on file.   Social History Main Topics  . Smoking status: Former Games developermoker  . Smokeless tobacco: Not on file     Comment: quit x 15 years  . Alcohol Use: No  . Drug Use: No  . Sexual Activity: Not on file   Other Topics Concern  . Not on file   Social History Narrative  . No narrative on file      ROS:  General: Negative for anorexia, weight loss, fever, chills, fatigue, weakness. Eyes: Negative for vision changes.  ENT: Negative for hoarseness, difficulty swallowing , nasal congestion. CV: Negative for chest pain, angina, palpitations, dyspnea on exertion, peripheral edema.  Respiratory: Negative for dyspnea at rest, dyspnea on  exertion, cough, sputum, wheezing.  GI: See history of present illness. GU:  Negative for dysuria, hematuria, urinary incontinence, urinary frequency, nocturnal urination.  MS: Negative for joint pain, low back pain.  Derm: Negative for rash or itching.  Neuro: Negative for weakness, abnormal sensation, seizure, frequent headaches, memory loss, confusion.  Psych: Negative for anxiety, depression, suicidal ideation, hallucinations.  Endo: Negative for unusual weight change.  Heme: Negative for bruising or bleeding. Allergy: Negative for rash or hives.    Physical Examination:  BP 116/68  Pulse 99  Temp(Src) 97.3 F (36.3 C) (Oral)  Ht  (1.702 m)  Wt 198 lb 3.2 oz (89.903 kg)  BMI 31.04 kg/m2   General:  Well-nourished, well-developed in no acute distress.  Head: Normocephalic, atraumatic.   Eyes: Conjunctiva pink, no icterus. Mouth: Oropharyngeal mucosa moist and pink , no lesions erythema or exudate. Neck: Supple without thyromegaly, masses, or lymphadenopathy.  Lungs: Clear to auscultation bilaterally.  Heart: Regular rate and rhythm, no murmurs rubs or gallops.  Abdomen: Bowel sounds are normal, nontender, nondistended, no hepatosplenomegaly or masses, no abdominal bruits or    hernia , no rebound or guarding.   Rectal: Not performed Extremities: No lower extremity edema. No clubbing or deformities.  Neuro: Alert and oriented x 4 , grossly normal neurologically.  Skin: Warm and dry, no rash or jaundice.   Psych: Alert and cooperative, normal mood and affect.

## 2013-11-03 NOTE — Patient Instructions (Signed)
1. Try Linzess one capsule daily on empty stomach for constipation. Voucher for 30 days free supply provided. You will need to take to your pharmacy to fill.  2. Colonoscopy as schedule. See separate instructions. 3. You may be a candidate for [facility] hemorrhoid banding. Dr. Jena Gauss will evaluate you at time of colonoscopy and let you know if you would benefit from this in-office procedure.

## 2013-11-03 NOTE — Assessment & Plan Note (Signed)
Discussed [facility] banding with patient. Dr. Jena Gauss to determine if good candidate at time of colonoscopy. Brochure provided.

## 2013-11-03 NOTE — Assessment & Plan Note (Signed)
Doing very well on protonix 40 mg daily. Continue anti-reflex measures.

## 2013-11-03 NOTE — Assessment & Plan Note (Signed)
Trial of Linzess daily for constipation. Samples and voucher provided with refills.

## 2013-11-04 MED ORDER — LINACLOTIDE 145 MCG PO CAPS: 145.0000 ug | ORAL_CAPSULE | Freq: Every day | ORAL | Status: DC

## 2013-11-09 NOTE — Progress Notes (Signed)
cc'ed to pcp °

## 2013-11-16 ENCOUNTER — Encounter (HOSPITAL_COMMUNITY): Payer: Self-pay | Admitting: Pharmacy Technician

## 2013-11-26 ENCOUNTER — Ambulatory Visit (HOSPITAL_COMMUNITY)
Admission: RE | Admit: 2013-11-26 | Discharge: 2013-11-26 | Disposition: A | Discharge status: Home / Self Care | Payer: No Typology Code available for payment source | Source: Ambulatory Visit | Attending: Internal Medicine | Admitting: Internal Medicine

## 2013-11-26 ENCOUNTER — Encounter (HOSPITAL_COMMUNITY): Payer: Self-pay | Admitting: *Deleted

## 2013-11-26 ENCOUNTER — Encounter (HOSPITAL_COMMUNITY)
Admission: RE | Disposition: A | Discharge status: Home / Self Care | Payer: Self-pay | Source: Ambulatory Visit | Attending: Internal Medicine

## 2013-11-26 DIAGNOSIS — Z87891 Personal history of nicotine dependence: Secondary | ICD-10-CM | POA: Insufficient documentation

## 2013-11-26 DIAGNOSIS — K5900 Constipation, unspecified: Secondary | ICD-10-CM | POA: Insufficient documentation

## 2013-11-26 DIAGNOSIS — K219 Gastro-esophageal reflux disease without esophagitis: Secondary | ICD-10-CM | POA: Diagnosis not present

## 2013-11-26 DIAGNOSIS — Z79899 Other long term (current) drug therapy: Secondary | ICD-10-CM | POA: Diagnosis not present

## 2013-11-26 DIAGNOSIS — Z83719 Family history of colon polyps, unspecified: Secondary | ICD-10-CM

## 2013-11-26 DIAGNOSIS — R1030 Lower abdominal pain, unspecified: Secondary | ICD-10-CM | POA: Diagnosis not present

## 2013-11-26 DIAGNOSIS — Z1211 Encounter for screening for malignant neoplasm of colon: Secondary | ICD-10-CM

## 2013-11-26 DIAGNOSIS — Z8371 Family history of colonic polyps: Secondary | ICD-10-CM | POA: Insufficient documentation

## 2013-11-26 DIAGNOSIS — Z7982 Long term (current) use of aspirin: Secondary | ICD-10-CM | POA: Insufficient documentation

## 2013-11-26 HISTORY — DX: Other intervertebral disc displacement, lumbar region: M51.26

## 2013-11-26 HISTORY — DX: Headache, unspecified: R51.9

## 2013-11-26 HISTORY — PX: COLONOSCOPY: SHX5424

## 2013-11-26 HISTORY — DX: Headache: R51

## 2013-11-26 HISTORY — DX: Depression, unspecified: F32.A

## 2013-11-26 HISTORY — DX: Anxiety disorder, unspecified: F41.9

## 2013-11-26 HISTORY — DX: Major depressive disorder, single episode, unspecified: F32.9

## 2013-11-26 SURGERY — COLONOSCOPY
Anesthesia: Moderate Sedation

## 2013-11-26 MED ORDER — STERILE WATER FOR IRRIGATION IR SOLN
Status: DC | PRN
  Administered 2013-11-26: 08:00:00

## 2013-11-26 MED ORDER — MIDAZOLAM HCL 5 MG/5ML IJ SOLN
INTRAMUSCULAR | Status: DC | PRN
  Administered 2013-11-26 (×2): 2 mg via INTRAVENOUS
  Administered 2013-11-26: 1 mg via INTRAVENOUS
  Administered 2013-11-26: 2 mg via INTRAVENOUS
  Administered 2013-11-26 (×3): 1 mg via INTRAVENOUS

## 2013-11-26 MED ORDER — MEPERIDINE HCL 100 MG/ML IJ SOLN
INTRAMUSCULAR | Status: DC | PRN
  Administered 2013-11-26: 25 mg via INTRAVENOUS
  Administered 2013-11-26: 50 mg via INTRAVENOUS
  Administered 2013-11-26: 25 mg via INTRAVENOUS

## 2013-11-26 MED ORDER — SODIUM CHLORIDE 0.9 % IJ SOLN
INTRAMUSCULAR | Status: AC
  Filled 2013-11-26: qty 10

## 2013-11-26 MED ORDER — MEPERIDINE HCL 100 MG/ML IJ SOLN
INTRAMUSCULAR | Status: AC
  Filled 2013-11-26: qty 2

## 2013-11-26 MED ORDER — SODIUM CHLORIDE 0.9 % IV SOLN
INTRAVENOUS | Status: DC
  Administered 2013-11-26: 08:00:00 via INTRAVENOUS

## 2013-11-26 MED ORDER — ONDANSETRON HCL 4 MG/2ML IJ SOLN
INTRAMUSCULAR | Status: AC
  Filled 2013-11-26: qty 2

## 2013-11-26 MED ORDER — PROMETHAZINE HCL 25 MG/ML IJ SOLN
INTRAMUSCULAR | Status: AC
  Filled 2013-11-26: qty 1

## 2013-11-26 MED ORDER — MIDAZOLAM HCL 5 MG/5ML IJ SOLN
INTRAMUSCULAR | Status: AC
  Filled 2013-11-26: qty 10

## 2013-11-26 MED ORDER — PROMETHAZINE HCL 25 MG/ML IJ SOLN
25.0000 mg | Freq: Once | INTRAMUSCULAR | Status: AC
  Administered 2013-11-26: 25 mg via INTRAVENOUS

## 2013-11-26 MED ORDER — ONDANSETRON HCL 4 MG/2ML IJ SOLN
INTRAMUSCULAR | Status: DC | PRN
  Administered 2013-11-26: 4 mg via INTRAVENOUS

## 2013-11-26 NOTE — Op Note (Signed)
[facility] [address] [place], [id number]   COLONOSCOPY PROCEDURE REPORT  PATIENT: Jeanne Fisher, Jeanne Fisher  MR#: [id number] BIRTHDATE: [birth year] , 48  yrs. old GENDER: female ENDOSCOPIST: R.  Roetta SessionsMichael Marlene Pfluger, MD FACP [facility] REFERRED XB:JYNWBY:John Norval GableHungarland, M.D. PROCEDURE DATE:  11/26/2013 PROCEDURE:   Colonoscopy, screening INDICATIONS:positive family history of colon polyps. MEDICATIONS: Versed 10 mg IV and Demerol 100 mg in divided doses. Phenergan 25 mg IV.  Zofran 4 mg IV. ASA CLASS:       Class II  CONSENT: The risks, benefits, alternatives and imponderables including but not limited to bleeding, perforation as well as the possibility of a missed lesion have been reviewed.  The potential for biopsy, lesion removal, etc. have also been discussed. Questions have been answered.  All parties agreeable.  Please see the history and physical in the medical record for more information.  DESCRIPTION OF PROCEDURE:   After the risks benefits and alternatives of the procedure were thoroughly explained, informed consent was obtained.  The digital rectal exam      The EC-[id number] ([id number]  endoscope was introduced through the anus and advanced to the   . No adverse events experienced.   The quality of the prep was adequate.  The instrument was then slowly withdrawn as the colon was fully examined.      COLON FINDINGS: Normal rectum.  Somewhat noncompliant left colon; otherwise, normal appearing colonic mucosa.     .  Withdrawal time=8 minutes 0 seconds.  The scope was withdrawn and the procedure completed. COMPLICATIONS: There were no immediate complications.  ENDOSCOPIC IMPRESSION: Normal rectum.  Somewhat noncompliant left colon; otherwise, normal appearing colonic mucosa  RECOMMENDATIONS: repeat high-risk screening colonoscopy in 5 years.  eSigned:  R. Roetta SessionsMichael Quiera Diffee, MD Jerrel IvoryFACP [facility] 11/26/2013 9:07 AM   cc:  CPT CODES: ICD CODES:  The ICD and CPT codes  recommended by this software are interpretations from the data that the clinical staff has captured with the software.  The verification of the translation of this report to the ICD and CPT codes and modifiers is the sole responsibility of the health care institution and practicing physician where this report was generated.  [employer] will not be held responsible for the validity of the ICD and CPT codes included on this report.  AMA assumes no liability for data contained or not contained herein. CPT is a Publishing rights managerregistered trademark of the [employer].

## 2013-11-26 NOTE — Interval H&P Note (Signed)
History and Physical Interval Note:  11/26/2013 8:23 AM  Jeanne Fisher  has presented today for surgery, with the diagnosis of family history colon polyps/crc  The various methods of treatment have been discussed with the patient and family. After consideration of risks, benefits and other options for treatment, the patient has consented to  Procedure(s) with comments: COLONOSCOPY (N/A) - 8:15 am - moved to 8:30 - Ginger to notify pt as a surgical intervention .  The patient's history has been reviewed, patient examined, no change in status, stable for surgery.  I have reviewed the patient's chart and labs.  Questions were answered to the patient's satisfaction.   No change aside from Linzess working very well for constipation. Screening colonoscopy per plan.  The risks, benefits, limitations, alternatives and imponderables have been reviewed with the patient. Questions have been answered. All parties are agreeable.   Eula Listenobert Irelynd Zumstein

## 2013-11-26 NOTE — Discharge Instructions (Signed)
°  Colonoscopy Discharge Instructions  Read the instructions outlined below and refer to this sheet in the next few weeks. These discharge instructions provide you with general information on caring for yourself after you leave the hospital. Your doctor may also give you specific instructions. While your treatment has been planned according to the most current medical practices available, unavoidable complications occasionally occur. If you have any problems or questions after discharge, call Dr. Jena Gaussourk at [phone]. ACTIVITY  You may resume your regular activity, but move at a slower pace for the next 24 hours.   Take frequent rest periods for the next 24 hours.   Walking will help get rid of the air and reduce the bloated feeling in your belly (abdomen).   No driving for 24 hours (because of the medicine (anesthesia) used during the test).    Do not sign any important legal documents or operate any machinery for 24 hours (because of the anesthesia used during the test).  NUTRITION  Drink plenty of fluids.   You may resume your normal diet as instructed by your doctor.   Begin with a light meal and progress to your normal diet. Heavy or fried foods are harder to digest and may make you feel sick to your stomach (nauseated).   Avoid alcoholic beverages for 24 hours or as instructed.  MEDICATIONS  You may resume your normal medications unless your doctor tells you otherwise.  WHAT YOU CAN EXPECT TODAY  Some feelings of bloating in the abdomen.   Passage of more gas than usual.   Spotting of blood in your stool or on the toilet paper.  IF YOU HAD POLYPS REMOVED DURING THE COLONOSCOPY:  No aspirin products for 7 days or as instructed.   No alcohol for 7 days or as instructed.   Eat a soft diet for the next 24 hours.  FINDING OUT THE RESULTS OF YOUR TEST Not all test results are available during your visit. If your test results are not back during the visit, make an appointment  with your caregiver to find out the results. Do not assume everything is normal if you have not heard from your caregiver or the medical facility. It is important for you to follow up on all of your test results.  SEEK IMMEDIATE MEDICAL ATTENTION IF:  You have more than a spotting of blood in your stool.   Your belly is swollen (abdominal distention).   You are nauseated or vomiting.   You have a temperature over 101.   You have abdominal pain or discomfort that is severe or gets worse throughout the day.     Repeat colonoscopy in 5 years for screening purposes

## 2013-11-26 NOTE — H&P (View-Only) (Signed)
Primary Care Physician:  Maximiano CossHUNGARLAND,JOHN DAVID, MD  Primary Gastroenterologist:  Roetta SessionsMichael Rourk, MD   Chief Complaint  Patient presents with  . Colonoscopy    HPI:  Jeanne Fisher is a 48 y.o. female here to schedule colonoscopy. She has a family history of colon cancer, maternal grandmother, died age 50. Her mother also has had multiple colon polyps, precancerous per patient. Dr. Jena Gaussourk advised for her to have her next colonoscopy age 48. Last one in 2009 without polyps.  Patient is chronically constipated. She takes MiraLax every couple of days but is not like the taste or consistency. She would prefer another option. Past few weeks she's noted some lower abdominal pain discomfort which she feels is associated to constipation. Occasional flare with hemorrhoids, using suppositories as needed.  No melena, brbpr. Heartburn doing well. No significant dysphagia. No nausea/vomiting. She's been able to loose some weight over the past couple of years. She is down about 30 pounds.  Current Outpatient Prescriptions  Medication Sig Dispense Refill  . amitriptyline (ELAVIL) 25 MG tablet Take 25 mg by mouth at bedtime.      Marland Kitchen. aspirin 81 MG tablet Take 81 mg by mouth daily.      Marland Kitchen. docusate sodium (COLACE) 100 MG capsule Take 100 mg by mouth as needed.      . pantoprazole (PROTONIX) 40 MG tablet Take 1 tablet (40 mg total) by mouth daily.  90 tablet  3  .       .        No current facility-administered medications for this visit.    Allergies as of 11/03/2013  . (No Known Allergies)    Past Medical History  Diagnosis Date  . GERD (gastroesophageal reflux disease)   . Constipation     Past Surgical History  Procedure Laterality Date  . Partial hysterectomy    . Cesaren section    . Cholecystectomy    . Breast reduction surgery    . Ruptured discs    . Esophagogastroduodenoscopy  11/12/2007    Subtle Schatzki ring, otherwise unremarkable esophagus status post    Maloney dilation  .  Colonoscopy  11/12/2007    Small hiatal hernia, otherwise normal stomach/ , internal hemorrhoids, otherwise normal    Family History  Problem Relation Age of Onset  . Colon cancer Maternal Grandmother     age 50, deceased  . Colon polyps Mother   . Esophageal cancer Maternal Grandfather   . Heart attack Father     died age 50  . Kidney cancer Paternal Grandmother     History   Social History  . Marital Status: Married    Spouse Name: N/A    Number of Children: 1  . Years of Education: N/A   Occupational History  . Not on file.   Social History Main Topics  . Smoking status: Former Games developermoker  . Smokeless tobacco: Not on file     Comment: quit x 15 years  . Alcohol Use: No  . Drug Use: No  . Sexual Activity: Not on file   Other Topics Concern  . Not on file   Social History Narrative  . No narrative on file      ROS:  General: Negative for anorexia, weight loss, fever, chills, fatigue, weakness. Eyes: Negative for vision changes.  ENT: Negative for hoarseness, difficulty swallowing , nasal congestion. CV: Negative for chest pain, angina, palpitations, dyspnea on exertion, peripheral edema.  Respiratory: Negative for dyspnea at rest, dyspnea on  exertion, cough, sputum, wheezing.  GI: See history of present illness. GU:  Negative for dysuria, hematuria, urinary incontinence, urinary frequency, nocturnal urination.  MS: Negative for joint pain, low back pain.  Derm: Negative for rash or itching.  Neuro: Negative for weakness, abnormal sensation, seizure, frequent headaches, memory loss, confusion.  Psych: Negative for anxiety, depression, suicidal ideation, hallucinations.  Endo: Negative for unusual weight change.  Heme: Negative for bruising or bleeding. Allergy: Negative for rash or hives.    Physical Examination:  BP 116/68  Pulse 99  Temp(Src) 97.3 F (36.3 C) (Oral)  Ht 5\' 7"  (1.702 m)  Wt 198 lb 3.2 oz (89.903 kg)  BMI 31.04 kg/m2   General:  Well-nourished, well-developed in no acute distress.  Head: Normocephalic, atraumatic.   Eyes: Conjunctiva pink, no icterus. Mouth: Oropharyngeal mucosa moist and pink , no lesions erythema or exudate. Neck: Supple without thyromegaly, masses, or lymphadenopathy.  Lungs: Clear to auscultation bilaterally.  Heart: Regular rate and rhythm, no murmurs rubs or gallops.  Abdomen: Bowel sounds are normal, nontender, nondistended, no hepatosplenomegaly or masses, no abdominal bruits or    hernia , no rebound or guarding.   Rectal: Not performed Extremities: No lower extremity edema. No clubbing or deformities.  Neuro: Alert and oriented x 4 , grossly normal neurologically.  Skin: Warm and dry, no rash or jaundice.   Psych: Alert and cooperative, normal mood and affect.

## 2013-11-29 ENCOUNTER — Encounter (HOSPITAL_COMMUNITY): Payer: Self-pay | Admitting: Internal Medicine

## 2013-12-13 ENCOUNTER — Telehealth: Payer: Self-pay

## 2013-12-13 NOTE — Telephone Encounter (Signed)
Pt is calling because her insurance will not cover her Linzess. Is there anything different she can try. Please advise

## 2013-12-13 NOTE — Telephone Encounter (Signed)
She can try Amitiza 8 g during meals twice a day; try samples first for couple of weeks

## 2013-12-14 NOTE — Telephone Encounter (Signed)
PA was done for the Linzess and just received response from insurance company today. They denied it because the pt had not tried lactulose or miralax. I spoke with the pt and she said she has tried miralax in the past and was on it for 2 years. There was no documentation of this in the chart that I could find. Pt wants me to redo PA paperwork because she will not be able to get off work in time to come by and get samples. PA has been redone and sent to the company.

## 2013-12-28 ENCOUNTER — Other Ambulatory Visit: Payer: Self-pay

## 2013-12-28 MED ORDER — HYDROCORTISONE ACETATE 25 MG RE SUPP: 25.0000 mg | Freq: Two times a day (BID) | RECTAL | Status: DC

## 2014-01-12 ENCOUNTER — Telehealth: Payer: Self-pay | Admitting: *Deleted

## 2014-01-12 NOTE — Telephone Encounter (Signed)
PA  APPROVED LINZESS 145 MCG

## 2014-01-20 NOTE — Telephone Encounter (Signed)
Error

## 2014-01-26 ENCOUNTER — Other Ambulatory Visit: Payer: Self-pay | Admitting: Gastroenterology

## 2014-09-22 ENCOUNTER — Other Ambulatory Visit: Payer: Self-pay

## 2014-09-22 MED ORDER — HYDROCORTISONE ACETATE 25 MG RE SUPP: 25.0000 mg | Freq: Two times a day (BID) | RECTAL | Status: DC

## 2015-01-16 ENCOUNTER — Other Ambulatory Visit: Payer: Self-pay | Admitting: Gastroenterology

## 2016-03-11 ENCOUNTER — Telehealth: Payer: Self-pay | Admitting: Internal Medicine

## 2016-03-11 ENCOUNTER — Encounter: Payer: Self-pay | Admitting: Internal Medicine

## 2016-03-11 MED ORDER — PANTOPRAZOLE SODIUM 40 MG PO TBEC
40.0000 mg | DELAYED_RELEASE_TABLET | Freq: Every day | ORAL | 0 refills | Status: DC
Start: 2016-03-11 — End: 2016-04-03

## 2016-03-11 NOTE — Telephone Encounter (Signed)
Please schedule ov.  

## 2016-03-11 NOTE — Telephone Encounter (Signed)
Please tell the patient I can send in 1-2 months worth of refills, ut she hasn't been seen in over 2 years and will need a follow-up visit for further refills.

## 2016-03-11 NOTE — Telephone Encounter (Signed)
Routing to the refill box. 

## 2016-03-11 NOTE — Telephone Encounter (Signed)
APPT MADE AND LETTER SENT  °

## 2016-03-11 NOTE — Telephone Encounter (Signed)
NEEDS A REFILL OF PROTONIX SENT TO [facility] [address]  [phone]

## 2016-04-03 ENCOUNTER — Ambulatory Visit (INDEPENDENT_AMBULATORY_CARE_PROVIDER_SITE_OTHER): Payer: PRIVATE HEALTH INSURANCE | Admitting: Nurse Practitioner

## 2016-04-03 ENCOUNTER — Encounter: Payer: Self-pay | Admitting: Nurse Practitioner

## 2016-04-03 VITALS — BP 123/73 | HR 95 | Temp 97.8°F | Ht 67.0 in | Wt 210.0 lb

## 2016-04-03 DIAGNOSIS — K219 Gastro-esophageal reflux disease without esophagitis: Secondary | ICD-10-CM

## 2016-04-03 DIAGNOSIS — K5904 Chronic idiopathic constipation: Secondary | ICD-10-CM

## 2016-04-03 MED ORDER — PANTOPRAZOLE SODIUM 40 MG PO TBEC
40.0000 mg | DELAYED_RELEASE_TABLET | Freq: Two times a day (BID) | ORAL | 1 refills | Status: DC
Start: 2016-04-03 — End: 2016-04-11

## 2016-04-03 MED ORDER — LINACLOTIDE 145 MCG PO CAPS: 145.0000 ug | ORAL_CAPSULE | Freq: Every day | ORAL | 3 refills | Status: DC

## 2016-04-03 NOTE — Patient Instructions (Signed)
1. I have sent in Protonix twice a day dosing to your pharmacy. 2. Call us and let us know if this helps. 3. I sent in a prescription for Linzess 145 g once a day. Take this on an empty stomach. 4. Return for follow-up in 3 months. 5. Call us if you have any worsening symptoms.

## 2016-04-03 NOTE — Assessment & Plan Note (Signed)
The patient has chronic GERD for a number of years. She is having morning breakthrough symptoms. Is on Protonix once a day. I will increase this to twice a day for the next 3 months to see if this helps her breakthrough symptoms. She is to call us with a progress report. Return for follow-up in 3 months.

## 2016-04-03 NOTE — Progress Notes (Signed)
Referring Provider: Maximiano CossHungarland, John David,* Primary Care Physician:  Maximiano CossHUNGARLAND,JOHN DAVID, MD Primary GI:  Dr. Jena Gaussourk  Chief Complaint  Patient presents with  . Medication refill    needs Protonix refill  . Constipation    Linzess helped but insurance didn't cover    HPI:   Jeanne Fisher is a 51 y.o. female who presents for follow-up on GERD for medication refills as well as constipation.  Today she states she is doing well overall. Initially staed Protonix works well for her. Then she states in the past few months she is having daily breakthrough GERD, typically in the morning. Takes Protonix once daily. States she was previously on Linzess which worked well for her but previous Buyer, retailinsurance wouldn't cover. Her new insurance will cover it with a 90 day supply. Does have intermittent constipation for which she takes a laxative for and wants to go back on linzess. Denies abdominal pain, N/V, hematochezia, melena. Denies chest pain, dyspnea, dizziness, lightheadedness, syncope, near syncope. Denies any other upper or lower GI symptoms.  Past Medical History:  Diagnosis Date  . Anxiety   . Constipation   . Depression   . GERD (gastroesophageal reflux disease)   . Headache   . Ruptured lumbar disc     Past Surgical History:  Procedure Laterality Date  . BREAST REDUCTION SURGERY    . cesaren section    . CHOLECYSTECTOMY    . COLONOSCOPY  11/12/2007   Small hiatal hernia, otherwise normal stomach/ , internal hemorrhoids, otherwise normal  . COLONOSCOPY N/A 11/26/2013   Procedure: COLONOSCOPY;  Surgeon: Corbin Adeobert M Rourk, MD;  Location: [facility];  Service: Endoscopy;  Laterality: N/A;  8:15 am - moved to 8:30 - Ginger to notify pt  . ESOPHAGOGASTRODUODENOSCOPY  11/12/2007   Subtle Schatzki ring, otherwise unremarkable esophagus status post    Maloney dilation  . PARTIAL HYSTERECTOMY      Current Outpatient Prescriptions  Medication Sig Dispense Refill  . amitriptyline  (ELAVIL) 25 MG tablet Take 25 mg by mouth at bedtime.    Marland Kitchen. aspirin 81 MG tablet Take 81 mg by mouth daily.    Marland Kitchen. docusate sodium (COLACE) 100 MG capsule Take 100 mg by mouth as needed.    . pantoprazole (PROTONIX) 40 MG tablet Take 1 tablet (40 mg total) by mouth daily before breakfast. 90 tablet 0   No current facility-administered medications for this visit.     Allergies as of 04/03/2016  . (No Known Allergies)    Family History  Problem Relation Age of Onset  . Heart attack Father     died age 53  . Colon cancer Maternal Grandmother     age 53, deceased  . Colon polyps Mother   . Esophageal cancer Maternal Grandfather   . Kidney cancer Paternal Grandmother     Social History   Social History  . Marital status: Married    Spouse name: N/A  . Number of children: 1  . Years of education: N/A   Social History Main Topics  . Smoking status: Former Smoker    Packs/day: 1.00    Years: 10.00    Types: Cigarettes    Quit date: 04/03/1997  . Smokeless tobacco: Never Used  . Alcohol use No  . Drug use: No  . Sexual activity: Not Asked   Other Topics Concern  . None   Social History Narrative  . None    Review of Systems: General: Negative for anorexia, weight loss,  fever, chills, fatigue, weakness. ENT: Negative for hoarseness, difficulty swallowing. CV: Negative for chest pain, angina, palpitations, peripheral edema.  Respiratory: Negative for dyspnea at rest, cough, sputum, wheezing.  GI: See history of present illness. Endo: Negative for unusual weight change.  Heme: Negative for bruising or bleeding.  Physical Exam: BP 123/73   Pulse 95   Temp 97.8 F (36.6 C) (Oral)   Ht 5\' 7"  (1.702 m)   Wt 210 lb (95.3 kg)   BMI 32.89 kg/m  General:   Alert and oriented. Pleasant and cooperative. Well-nourished and well-developed.  Head:  Normocephalic and atraumatic. Eyes:  Without icterus, sclera clear and conjunctiva pink.  Ears:  Normal auditory  acuity. Cardiovascular:  S1, S2 present without murmurs appreciated. Extremities without clubbing or edema. Respiratory:  Clear to auscultation bilaterally. No wheezes, rales, or rhonchi. No distress.  Gastrointestinal:  +BS, rounded but soft, non-tender and non-distended. No HSM noted. No guarding or rebound. No masses appreciated.  Rectal:  Deferred  Musculoskalatal:  Symmetrical without gross deformities. Neurologic:  Alert and oriented x4;  grossly normal neurologically. Psych:  Alert and cooperative. Normal mood and affect. Heme/Lymph/Immune: No excessive bruising noted.    04/03/2016 12:18 PM   Disclaimer: This note was dictated with voice recognition software. Similar sounding words can inadvertently be transcribed and may not be corrected upon review.

## 2016-04-03 NOTE — Progress Notes (Signed)
cc'ed to pcp °

## 2016-04-03 NOTE — Assessment & Plan Note (Signed)
History of constipation for which Linzess worked well previously. Her previous insurance would not pay for Linzess but her new insurance will. I will send in a prescription for Linzess 145 g daily for her. Return for follow-up in 3 months. I cautioned her about possibility of initial diarrhea which tends to self resolved in 3-5 days.

## 2016-04-08 ENCOUNTER — Telehealth: Payer: Self-pay | Admitting: Nurse Practitioner

## 2016-04-08 DIAGNOSIS — K219 Gastro-esophageal reflux disease without esophagitis: Secondary | ICD-10-CM

## 2016-04-08 DIAGNOSIS — K5904 Chronic idiopathic constipation: Secondary | ICD-10-CM

## 2016-04-08 NOTE — Telephone Encounter (Signed)
Patient called and stated her insurance will not let her have protonix increased to 2x a day as was recommended from here.    Please call her

## 2016-04-10 NOTE — Telephone Encounter (Signed)
Spoke with the pt- she said she checked with her insurance today and they will only pay for protonix once a day and will only allow #90 a year. She said she has been looking online and she can get #180 (3 month supply) for $25 thru [facility]. Pt is requesting we mail her a printed rx so she can mail it to them. Can you print and rx for her?

## 2016-04-11 MED ORDER — PANTOPRAZOLE SODIUM 40 MG PO TBEC: 40.0000 mg | DELAYED_RELEASE_TABLET | Freq: Two times a day (BID) | ORAL | 3 refills | Status: DC

## 2016-04-11 NOTE — Telephone Encounter (Signed)
Rx printed per patient request.

## 2016-04-11 NOTE — Addendum Note (Signed)
Addended by: Delane GingerGILL, ERIC A on: 04/11/2016 08:54 AM   Modules accepted: Orders

## 2016-04-11 NOTE — Telephone Encounter (Signed)
rx mailed to the pt

## 2016-04-23 NOTE — Telephone Encounter (Signed)
Pt called, she has not received the rx in the mail. Darl PikesSusan informed her that it was mailed on 04/11/16, but I would mail her another rx or she could come and pick it up. Pt said she would like for it to be mailed to her again. I have written an rx for pantoprazole 40mg  bid per what EG had written on 04/11/16 and it is in the mail to the pt. Darl PikesSusan verified the patients address.

## 2016-04-23 NOTE — Telephone Encounter (Signed)
Noted, no further recommendations. 

## 2016-06-06 ENCOUNTER — Other Ambulatory Visit: Payer: Self-pay | Admitting: Nurse Practitioner

## 2016-06-29 ENCOUNTER — Other Ambulatory Visit: Payer: Self-pay | Admitting: Nurse Practitioner

## 2016-07-01 ENCOUNTER — Encounter: Payer: Self-pay | Admitting: Nurse Practitioner

## 2016-07-01 ENCOUNTER — Ambulatory Visit: Payer: PRIVATE HEALTH INSURANCE | Admitting: Nurse Practitioner

## 2016-07-01 ENCOUNTER — Telehealth: Payer: Self-pay | Admitting: Nurse Practitioner

## 2016-07-01 NOTE — Telephone Encounter (Signed)
PATIENT WAS A NO SHOW AND LETTER SENT  °

## 2016-07-02 NOTE — Telephone Encounter (Signed)
Noted  

## 2017-04-04 ENCOUNTER — Other Ambulatory Visit: Payer: Self-pay | Admitting: Nurse Practitioner

## 2017-04-04 DIAGNOSIS — K219 Gastro-esophageal reflux disease without esophagitis: Secondary | ICD-10-CM

## 2017-04-04 DIAGNOSIS — K5904 Chronic idiopathic constipation: Secondary | ICD-10-CM

## 2017-05-22 ENCOUNTER — Telehealth: Payer: Self-pay

## 2017-05-22 DIAGNOSIS — K5904 Chronic idiopathic constipation: Secondary | ICD-10-CM

## 2017-05-22 DIAGNOSIS — K219 Gastro-esophageal reflux disease without esophagitis: Secondary | ICD-10-CM

## 2017-05-22 MED ORDER — PANTOPRAZOLE SODIUM 40 MG PO TBEC: 40.0000 mg | DELAYED_RELEASE_TABLET | Freq: Two times a day (BID) | ORAL | 3 refills | Status: DC

## 2017-05-22 NOTE — Addendum Note (Signed)
Addended by: Tiffany Kocher on: 05/22/2017 10:36 PM   Modules accepted: Orders

## 2017-05-22 NOTE — Telephone Encounter (Signed)
Refill request received from [facility] for Pantoprazole 40mg  take one tab po bid before meals. #180

## 2017-10-21 ENCOUNTER — Telehealth: Payer: Self-pay | Admitting: Internal Medicine

## 2017-10-21 ENCOUNTER — Encounter: Payer: Self-pay | Admitting: Gastroenterology

## 2017-10-21 ENCOUNTER — Ambulatory Visit: Payer: Self-pay | Admitting: Gastroenterology

## 2017-10-21 ENCOUNTER — Encounter

## 2017-10-21 VITALS — BP 125/76 | HR 101 | Temp 97.3°F | Ht 67.0 in | Wt 203.0 lb

## 2017-10-21 DIAGNOSIS — K5900 Constipation, unspecified: Secondary | ICD-10-CM

## 2017-10-21 DIAGNOSIS — K219 Gastro-esophageal reflux disease without esophagitis: Secondary | ICD-10-CM

## 2017-10-21 MED ORDER — DEXLANSOPRAZOLE 60 MG PO CPDR: 60.0000 mg | DELAYED_RELEASE_CAPSULE | Freq: Every day | ORAL | 3 refills | Status: DC

## 2017-10-21 NOTE — Progress Notes (Signed)
CC'D TO PCP °

## 2017-10-21 NOTE — Progress Notes (Signed)
      Primary Care Physician: Maximiano Coss, MD  Primary Gastroenterologist:  Roetta Sessions, MD   Chief Complaint  Patient presents with  . Gastroesophageal Reflux  . Nausea    HPI: Jeanne Fisher is a 52 y.o. female here for further evaluation of gastroesophageal reflux disease and nausea.  She was seen back in February 2018 for the same as well as constipation.  She was having breakthrough symptoms on pantoprazole daily so her dose was increased to twice daily with good results.    Constipation is managed with Linzess 145 mcg daily.  Nausea for few months. Eating doesn't help. Zantac 150mg  and chew two alka seltzer seems to make it ease off. Symptoms recur shortly after however.  Symptoms have been occurring every day.  Often wakes up at night with acid reflux.  No dysphagia.  No abdominal pain.  Bowel movements are regular on Linzess.  No melena or rectal bleeding. Allows 2-3 hours between meals and laying down. Previously failed Nexium.    Current Outpatient Medications  Medication Sig Dispense Refill  . amitriptyline (ELAVIL) 25 MG tablet Take 25 mg by mouth at bedtime.    Marland Kitchen aspirin 81 MG tablet Take 81 mg by mouth daily.    Marland Kitchen docusate sodium (COLACE) 100 MG capsule Take 100 mg by mouth as needed.    Marland Kitchen LINZESS 145 MCG CAPS capsule TAKE ONE CAPSULE BY MOUTH EVERY DAY BEFORE BREAKFAST 90 capsule 3  . pantoprazole (PROTONIX) 40 MG tablet Take 1 tablet (40 mg total) by mouth 2 (two) times daily before a meal. 180 tablet 3   No current facility-administered medications for this visit.     Allergies as of 10/21/2017  . (No Known Allergies)    ROS:  General: Negative for anorexia, weight loss, fever, chills, fatigue, weakness. ENT: Negative for hoarseness, difficulty swallowing , nasal congestion. CV: Negative for chest pain, angina, palpitations, dyspnea on exertion, peripheral edema.  Respiratory: Negative for dyspnea at rest, dyspnea on exertion, cough, sputum,  wheezing.  GI: See history of present illness. GU:  Negative for dysuria, hematuria, urinary incontinence, urinary frequency, nocturnal urination.  Endo: Negative for unusual weight change.    Physical Examination:   BP 125/76   Pulse (!) 101   Temp (!) 97.3 F (36.3 C) (Oral)   Ht 5\' 7"  (1.702 m)   Wt 203 lb (92.1 kg)   BMI 31.79 kg/m   General: Well-nourished, well-developed in no acute distress.  Eyes: No icterus. Mouth: Oropharyngeal mucosa moist and pink , no lesions erythema or exudate. Lungs: Clear to auscultation bilaterally.  Heart: Regular rate and rhythm, no murmurs rubs or gallops.  Abdomen: Bowel sounds are normal, nontender, nondistended, no hepatosplenomegaly or masses, no abdominal bruits or hernia , no rebound or guarding.   Extremities: No lower extremity edema. No clubbing or deformities. Neuro: Alert and oriented x 4   Skin: Warm and dry, no jaundice.   Psych: Alert and cooperative, normal mood and affect.

## 2017-10-21 NOTE — Assessment & Plan Note (Addendum)
Breakthrough symptoms on pantoprazole 40 mg twice daily.  At this time we will switch to Dexilant 60 mg daily.  Rx sent as well as she was provided rebate card and samples.  She will call if symptoms do not settle down.  Reinforced antireflux measures.  No alarm symptoms at this time.

## 2017-10-21 NOTE — Telephone Encounter (Signed)
PATIENT HAS NEW INSURANCE AND I TOLD HER THAT IT WAS A PLAN THAT HAD NO  OON BENEFITS. [employer].  SHE SAID SHE WANTED TO KEEP THIS APPOINTMENT AND PAY CASH FOR THE VISIT BECAUSE SHE DID NOT WANT TO CHANGE HER DOCTOR, SHE HAS BEEN COMING HERE FOR YEARS

## 2017-10-21 NOTE — Patient Instructions (Addendum)
1. Stop pantoprazole. Start Dexilant once daily before breakfast. Samples provided along with rebate card. RX sent to [facility].  2. Call if your symptoms do not settle down.  3. Continue Linzess as before.  4. We will touch base with you next year when your colonoscopy is due (11/2018).

## 2017-10-21 NOTE — Assessment & Plan Note (Signed)
Doing well on Linzess.  Continue current regimen.  Patient is due for high risk colon cancer screening next October given family history of colon polyps, mother.  Patient is aware recommendations.  We will see her back on a as needed basis in the meantime.

## 2017-10-21 NOTE — Telephone Encounter (Signed)
Noted  

## 2017-10-23 ENCOUNTER — Telehealth: Payer: Self-pay

## 2017-10-23 NOTE — Telephone Encounter (Signed)
PA for Dexilant 60 mg has been approved  Through [email or web address]. [facility] is aware of approval. Approval letter will be scanned into chart when it is received.

## 2017-10-23 NOTE — Telephone Encounter (Signed)
VM received. Pt went to pick her Dexilant RX and it was $300.00 even with it being approved from the PA. Pt also used Dexliant card and medication was still $300.00. Waiting on a return call from pt.

## 2017-11-06 MED ORDER — OMEPRAZOLE 40 MG PO CPDR: 40.0000 mg | DELAYED_RELEASE_CAPSULE | Freq: Every day | ORAL | 3 refills | Status: DC

## 2017-11-06 NOTE — Telephone Encounter (Signed)
Can you please find out what is on her formulary?

## 2017-11-06 NOTE — Addendum Note (Signed)
Addended by: Tiffany KocherLEWIS, LESLIE S on: 11/06/2017 01:18 PM   Modules accepted: Orders

## 2017-11-06 NOTE — Telephone Encounter (Signed)
[facility] with Pat[facility] from [facility]. Dexilant is $300 due to pt not meeting her deductible. Other PPI's require a PA due to qty amount. Protonix 40 mg once daily is 38.46, Omeprazole 40 mg once daily is $10 and Omeprazole 40 mg bid is $10 with PA for Qty. This is all the info, I could receive for PPI coverage.

## 2017-11-06 NOTE — Telephone Encounter (Signed)
Is there something else more cost effective pt can take? Medication was still $300 with PA approval.

## 2017-11-06 NOTE — Telephone Encounter (Addendum)
Let's try omeprazole 40mg  daily. [facility] [place] [facility] [place]'s where we [place] rx. She has four pharmacies listed

## 2017-11-06 NOTE — Telephone Encounter (Signed)
Spoke with Pt. Will wait to see if RX is going to need PA. Pharmacy will send PA info if needed.

## 2018-01-12 ENCOUNTER — Other Ambulatory Visit: Payer: Self-pay

## 2018-01-14 MED ORDER — OMEPRAZOLE 40 MG PO CPDR: 40.0000 mg | DELAYED_RELEASE_CAPSULE | Freq: Every day | ORAL | 1 refills | Status: DC

## 2018-01-19 ENCOUNTER — Other Ambulatory Visit: Payer: Self-pay

## 2018-01-19 MED ORDER — OMEPRAZOLE 40 MG PO CPDR: 40.0000 mg | DELAYED_RELEASE_CAPSULE | Freq: Every day | ORAL | 3 refills | Status: DC

## 2018-08-07 ENCOUNTER — Other Ambulatory Visit: Payer: Self-pay | Admitting: Nurse Practitioner

## 2018-11-18 ENCOUNTER — Encounter: Payer: Self-pay | Admitting: Internal Medicine

## 2019-05-25 ENCOUNTER — Other Ambulatory Visit: Payer: Self-pay | Admitting: Gastroenterology

## 2019-08-13 ENCOUNTER — Other Ambulatory Visit: Payer: Self-pay | Admitting: Obstetrics & Gynecology

## 2019-08-13 DIAGNOSIS — M5416 Radiculopathy, lumbar region: Secondary | ICD-10-CM

## 2019-09-01 ENCOUNTER — Ambulatory Visit
Admission: RE | Admit: 2019-09-01 | Discharge: 2019-09-01 | Disposition: A | Discharge status: Home / Self Care | Payer: BLUE CROSS/BLUE SHIELD | Source: Ambulatory Visit | Attending: Obstetrics & Gynecology | Admitting: Obstetrics & Gynecology

## 2019-09-01 DIAGNOSIS — M5416 Radiculopathy, lumbar region: Secondary | ICD-10-CM

## 2019-09-01 IMAGING — MR MR LUMBAR SPINE W/O CM
4 of 5 series · 26 of 48 positions shown · non-contrast
Comparison: None.

CLINICAL DATA: Low back and bilateral hip and leg pain for 2 years.

EXAM:
MRI LUMBAR SPINE WITHOUT CONTRAST
TECHNIQUE: Multiplanar, multisequence MR imaging of the lumbar spine was
performed. No intravenous contrast was administered.

[Series 3: T2 post-contrast · sagittal · 4.0mm · 0.55mm/px · 6 of 13 slices shown]
[im 1/13]
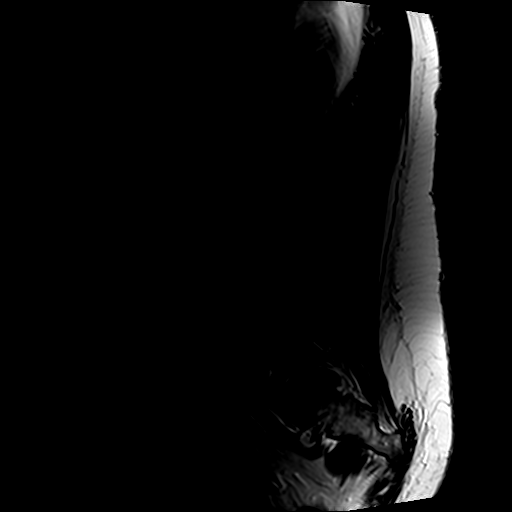
[im 3/13]
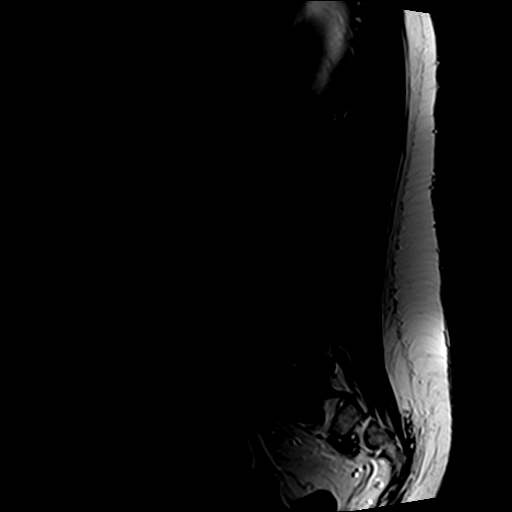
[im 5/13]
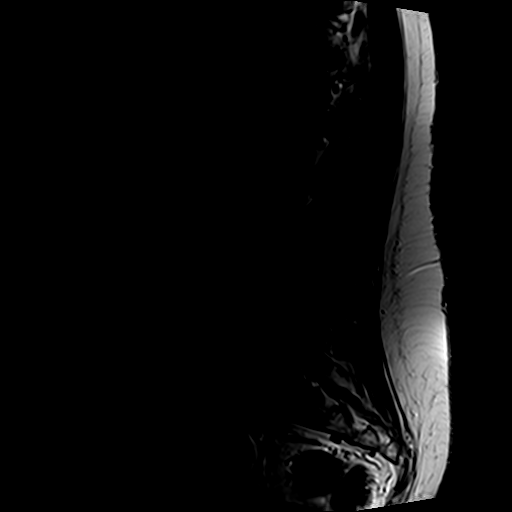
[im 8/13]
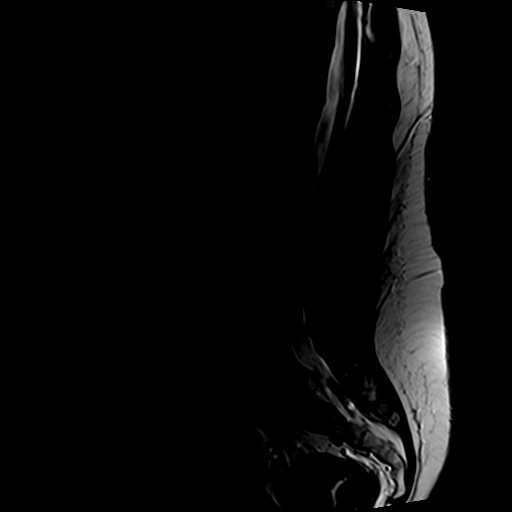
[im 10/13]
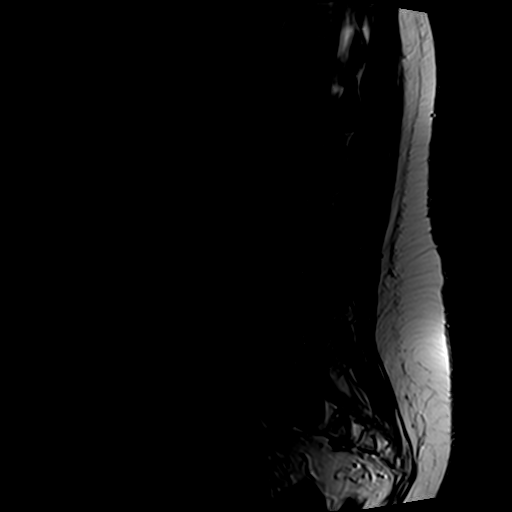
[im 13/13]
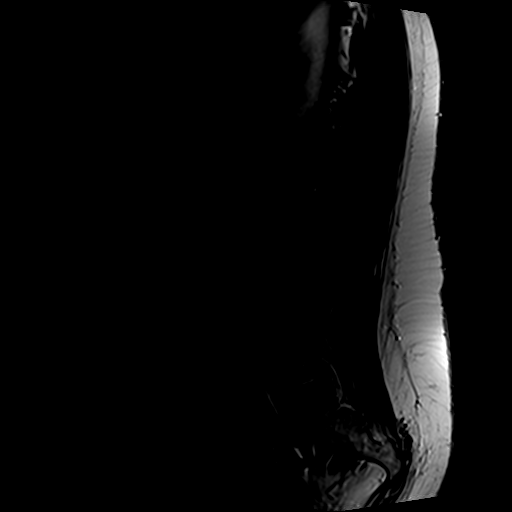

[Series 5: T1 · sagittal · 4.0mm · 0.55mm/px · 5 of 13 slices shown (1 of 2)]
[im 1/13]
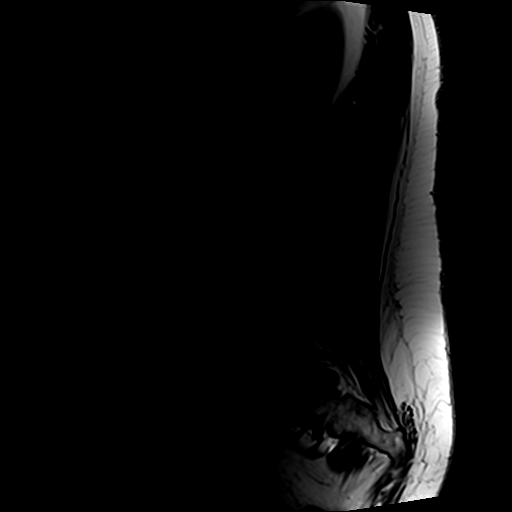
[im 4/13]
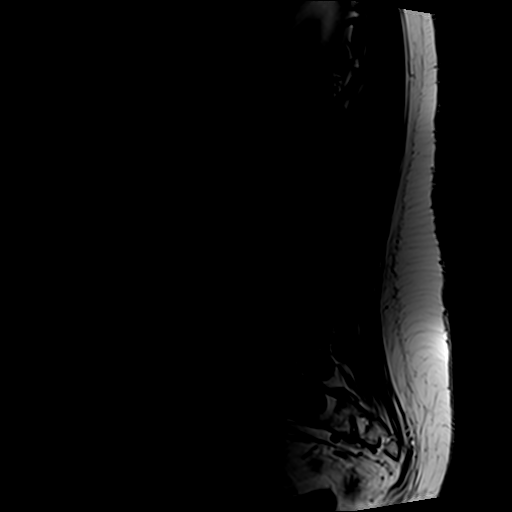
[im 7/13]
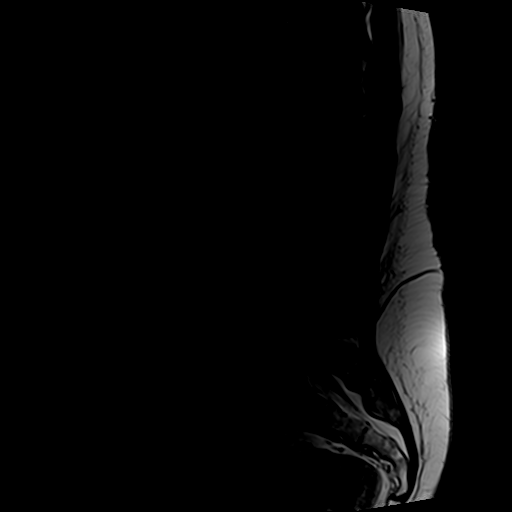
[im 10/13]
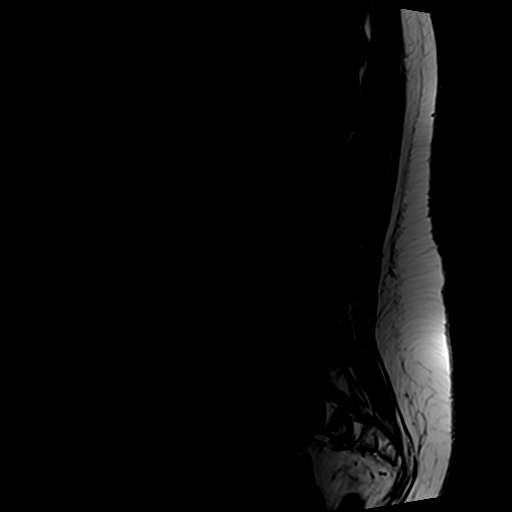
[im 13/13]
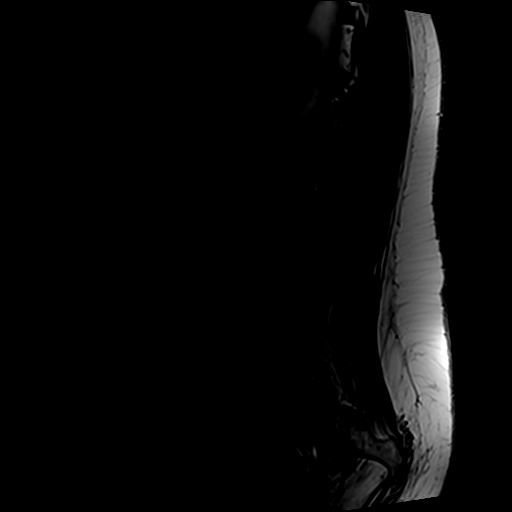

[Series 6: T1 · axial · 4.0mm · 0.35mm/px · z∈[-34,+139]mm · 5 of 38 slices shown (2 of 2)]
[im 3/38]
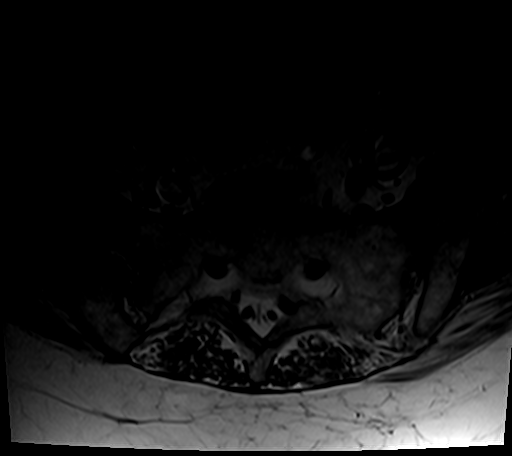
[im 5/38]
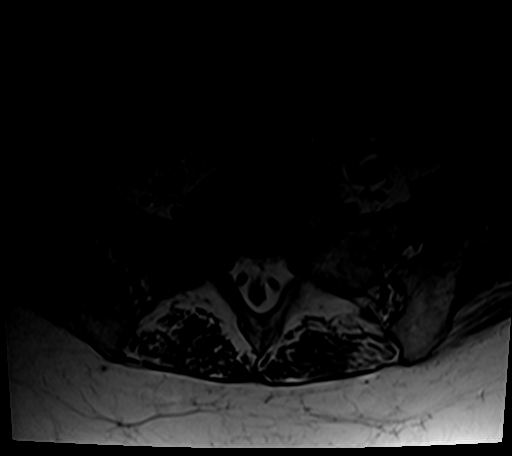
[im 8/38]
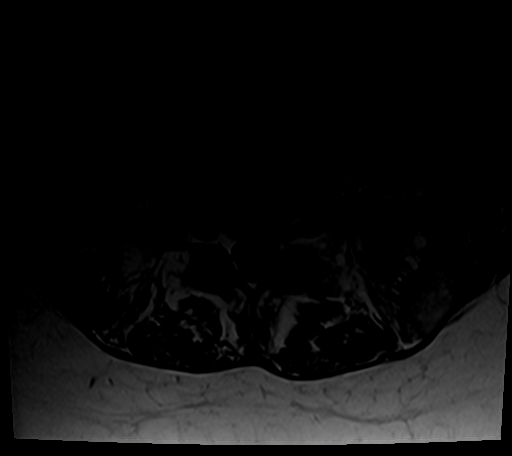
[im 20/38]
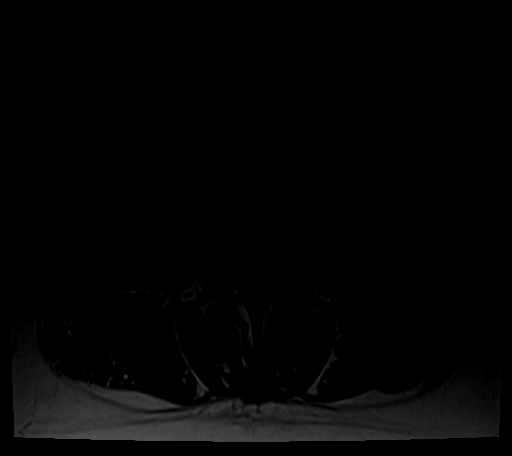
[im 33/38]
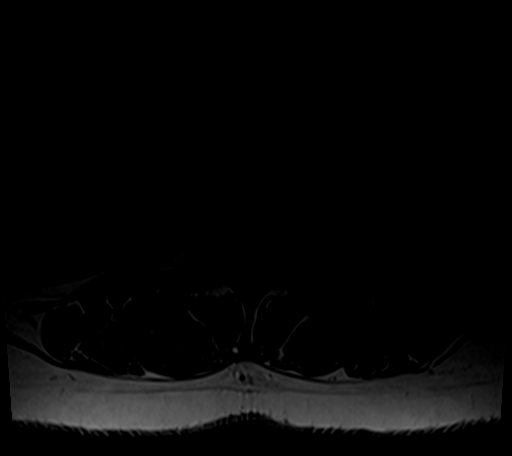

[Series 7: T2 · axial · 4.0mm · 0.70mm/px · z∈[-34,+164]mm · 10 of 38 slices shown]
[im 3/38]
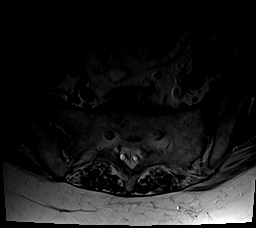
[im 5/38]
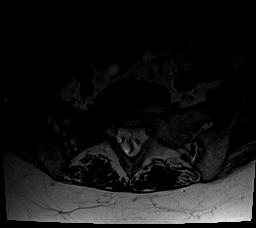
[im 8/38]
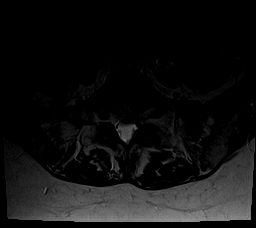
[im 13/38]
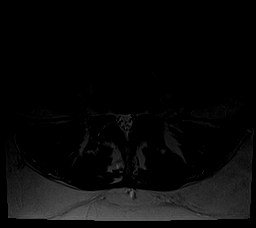
[im 18/38]
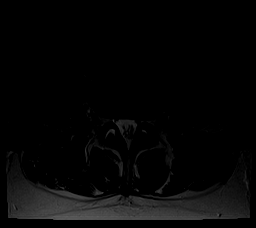
[im 20/38]
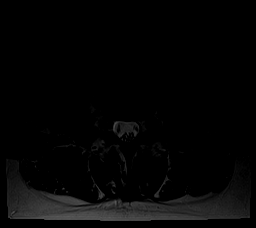
[im 23/38]
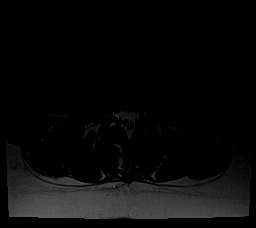
[im 28/38]
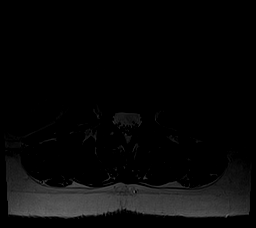
[im 33/38]
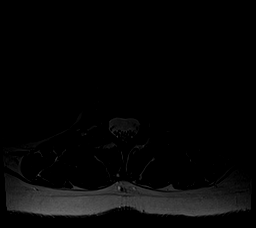
[im 38/38]
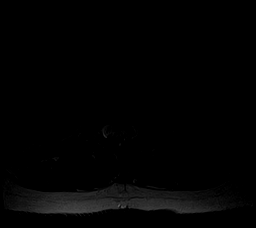

[26 of 48 positions shown; findings below may reference images not displayed]

FINDINGS: Segmentation: Transitional lumbar anatomy. The last full
intervertebral disc space is labeled L5-S1. There is a small remnant
disc space at at S1-2.

Alignment:  Normal overall alignment.

Vertebrae: Normal marrow signal except for mild endplate reactive
changes. No bone lesions or fractures.

Conus medullaris and cauda equina: Conus extends to the L1 level.
Conus and cauda equina appear normal.

Paraspinal and other soft tissues: No significant paraspinal or
retroperitoneal findings.

Disc levels:

L1-2: No significant findings.

L2-3: No significant findings.

L3-4: Bulging annulus and slightly uncovered disc with mild
flattening of the thecal sac and mild lateral recess encroachment.
Focal right-sided disc osteophyte complex appears to contact and
slightly displace the right L3 nerve root. No left foraminal
stenosis or spinal stenosis.

L4-5: Mild annular bulge and moderate facet disease but no
significant spinal stenosis. Mild lateral recess encroachment. No
foraminal stenosis.

L5-S1: Bulging annulus and central disc protrusion in combination
with facet disease contributing to mild spinal and bilateral lateral
recess stenosis. No significant foraminal stenosis.

S1-2: No significant findings. Small degenerated disc space.
IMPRESSION: 1. Focal right-sided disc osteophyte complex at L3-4 appears to
contact and slightly displace the right L3 nerve root.
2. Mild lateral recess encroachment bilaterally at L4-5.
3. Bulging annulus and central disc protrusion at L5-S1 in
combination with facet disease contributing to mild spinal and
bilateral lateral recess stenosis.
4. Transitional lumbar anatomy as discussed above. Correlation with
lumbar radiographs suggested.

## 2019-11-30 ENCOUNTER — Other Ambulatory Visit: Payer: Self-pay | Admitting: Gastroenterology

## 2020-02-21 ENCOUNTER — Other Ambulatory Visit: Payer: Self-pay | Admitting: Neurosurgery

## 2020-02-21 ENCOUNTER — Encounter (HOSPITAL_COMMUNITY): Payer: Self-pay | Admitting: Neurosurgery

## 2020-02-21 ENCOUNTER — Ambulatory Visit (HOSPITAL_COMMUNITY)
Admission: RE | Admit: 2020-02-21 | Discharge: 2020-02-21 | Disposition: A | Discharge status: Home / Self Care | Payer: Self-pay | Source: Ambulatory Visit | Attending: Neurosurgery | Admitting: Neurosurgery

## 2020-02-21 ENCOUNTER — Other Ambulatory Visit: Payer: Self-pay

## 2020-02-21 DIAGNOSIS — M48062 Spinal stenosis, lumbar region with neurogenic claudication: Secondary | ICD-10-CM

## 2020-02-21 IMAGING — MR MR LUMBAR SPINE WO/W CM
5 of 8 series · 27 of 48 positions shown · IV contrast (gadavist)
Comparison: Lumbar spine MRI [DATE]. Lumbar spine radiographs
[DATE].

CLINICAL DATA: Neurogenic claudication due to lumbar spinal
stenosis. Additional history provided: Patient reports lumbar
surgery 4 weeks ago, CSF leak.

EXAM:
MRI LUMBAR SPINE WITHOUT AND WITH CONTRAST
TECHNIQUE: Multiplanar and multiecho pulse sequences of the lumbar spine were
obtained without and with intravenous contrast.
CONTRAST:  9mL GADAVIST GADOBUTROL 1 MMOL/ML IV SOLN

[Series 5: T2 · sagittal · 4.0mm · 0.75mm/px · 3 of 17 slices shown (1 of 2)]
[im 1/17]
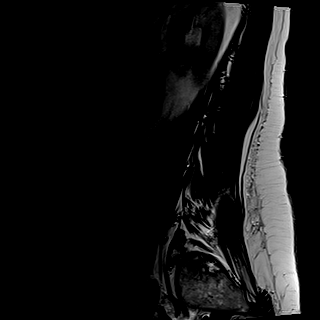
[im 9/17]
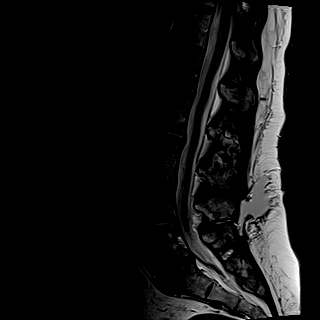
[im 17/17]
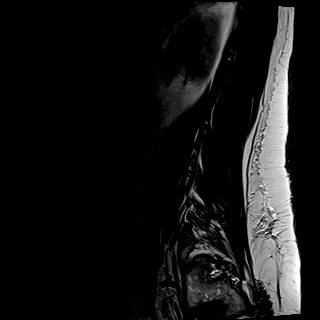

[Series 6: T1 · sagittal · 4.0mm · 0.75mm/px · 4 of 17 slices shown (1 of 2)]
[im 1/17]
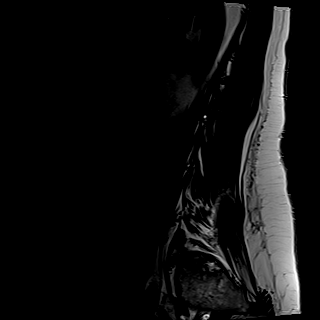
[im 6/17]
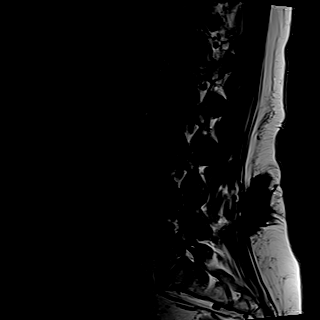
[im 11/17]
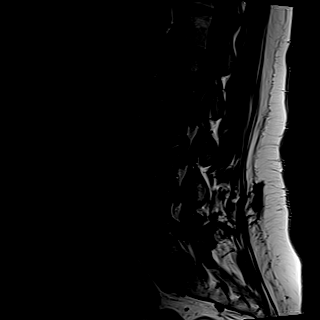
[im 17/17]
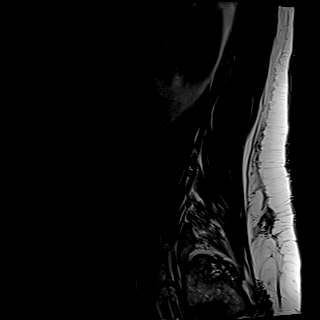

[Series 10: T2 · axial · 4.0mm · 0.62mm/px · z∈[-96,+84]mm · 8 of 32 slices shown (2 of 2)]
[im 1/32]
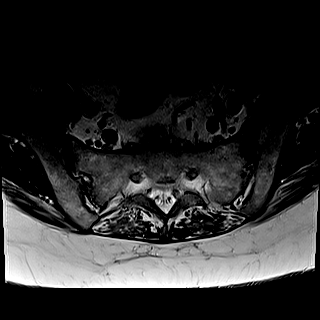
[im 5/32]
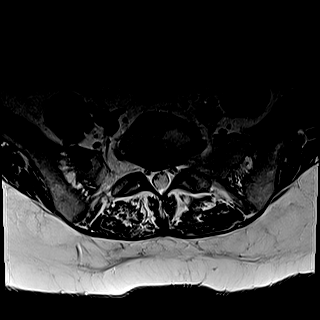
[im 9/32]
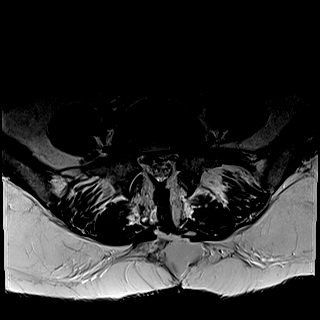
[im 14/32]
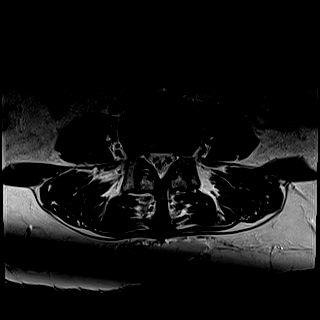
[im 18/32]
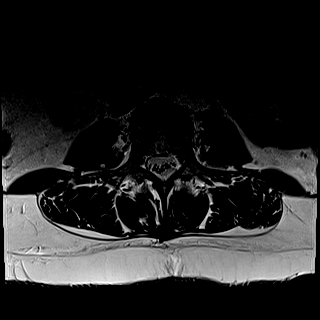
[im 23/32]
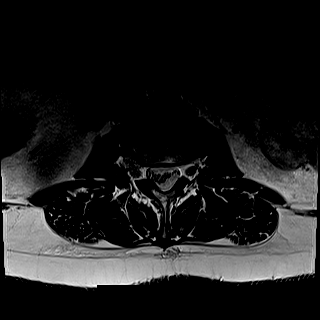
[im 27/32]
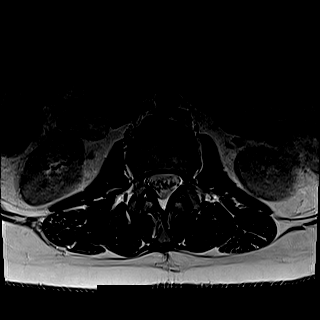
[im 32/32]
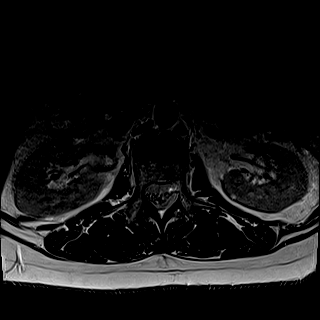

[Series 11: T1 · axial · 4.0mm · 0.39mm/px · z∈[-96,+84]mm · 8 of 32 slices shown (2 of 2)]
[im 1/32]
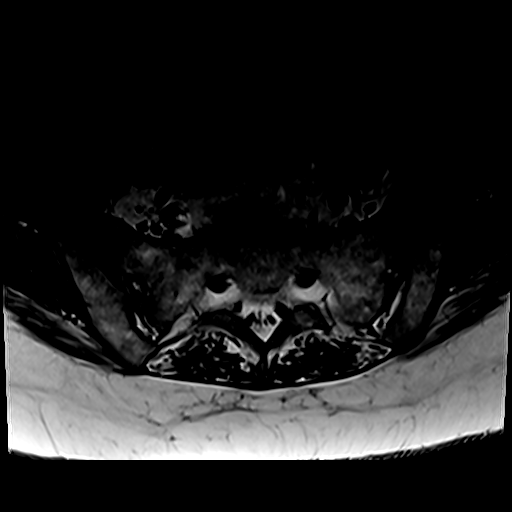
[im 5/32]
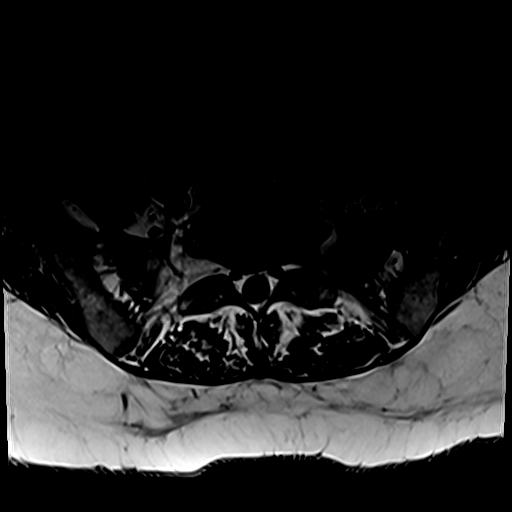
[im 9/32]
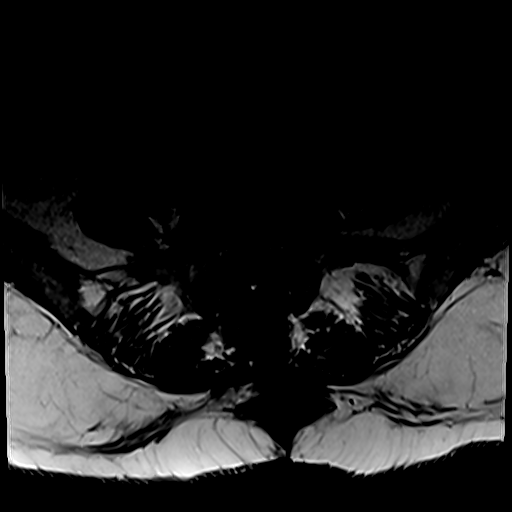
[im 14/32]
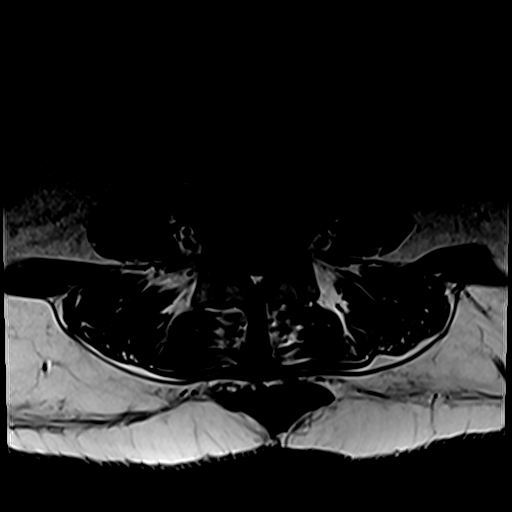
[im 18/32]
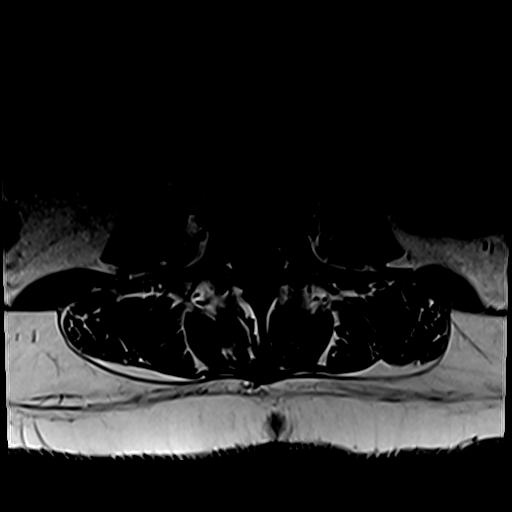
[im 23/32]
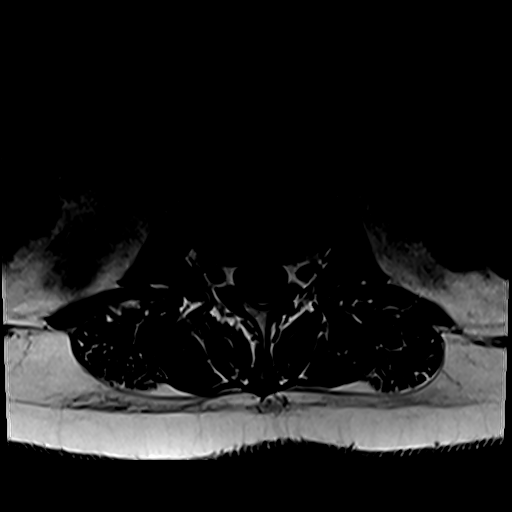
[im 27/32]
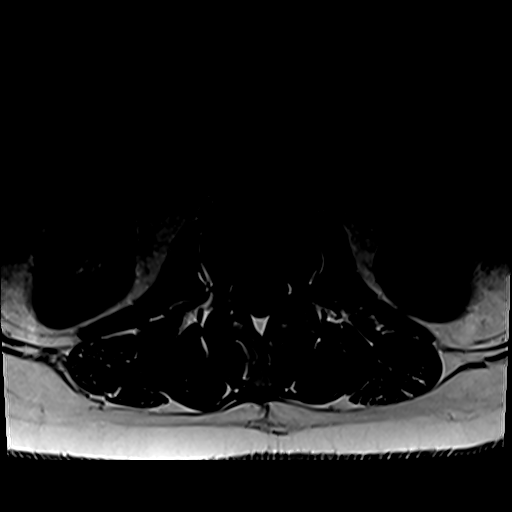
[im 32/32]
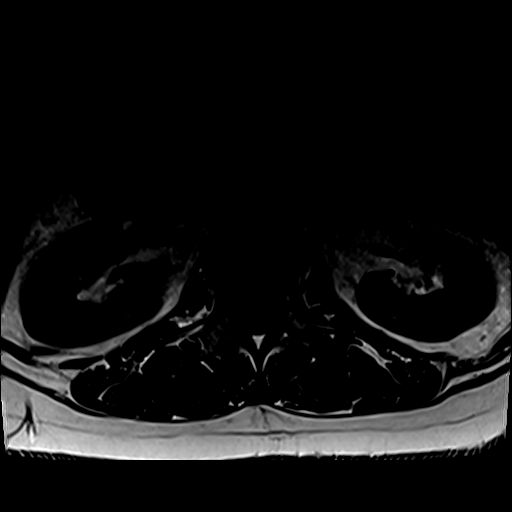

[Series 12: T1 fat-sat post-contrast · sagittal · 4.0mm · 0.75mm/px · 4 of 17 slices shown]
[im 1/17]
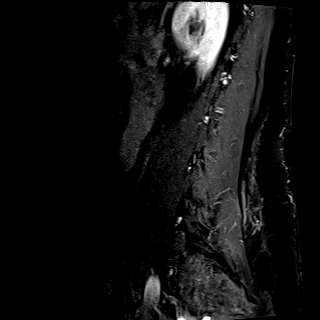
[im 6/17]
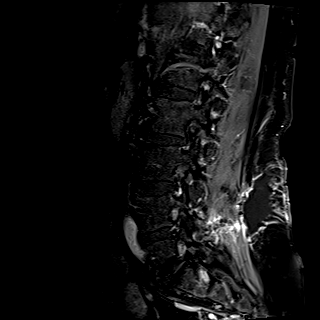
[im 11/17]
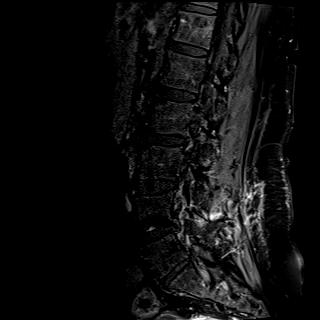
[im 17/17]
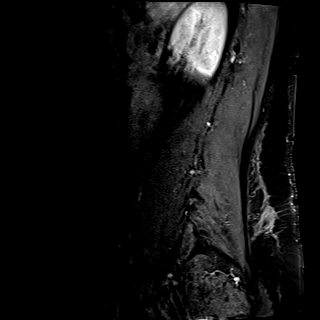

[27 of 48 positions shown; findings below may reference images not displayed]

FINDINGS: Segmentation: Redemonstrated transitional lumbosacral anatomy.
Spinal numbering will remain consistent with that utilized on the
prior lumbar spine MRI of [DATE]. By this numbering scheme, the
S1 level is partially lumbarized and there is a relatively
well-formed S1-S2 interspace.

Alignment: 2 mm grade 1 retrolisthesis at L2-L3, L3-L4, L4-L5 and
L5-S1.

Vertebrae: Trace degenerative endplate edema and enhancement at
L3-L4. No significant marrow edema identified elsewhere. No focal
suspicious osseous lesion.

Conus medullaris and cauda equina: Conus extends to the L1 level. No
signal abnormality within the visualized distal spinal cord. No
abnormal enhancement of the visualized distal spinal cord or cauda
equina nerve roots.

Paraspinal and other soft tissues: New from the prior MRI of
[DATE], there is a nonspecific peripherally enhancing fluid
collection measuring 5.3 x 4.5 x 5.2 cm extending from the L5-S1
laminectomy sites and abutting the skin surface posteriorly.

Disc levels:

Unless otherwise stated, the level by level findings below have not
significantly changed since prior MRI [DATE].

Mild multilevel disc degeneration greatest at L3-L4, L5-S1 and
S1-S2.

T12-L1: Imaged sagittally. No significant disc herniation or
stenosis.

L1-L2: Imaged sagittally. No significant disc herniation or
stenosis.

L2-L3: Grade 1 retrolisthesis. No significant disc herniation or
stenosis.

L3-L4: Grade 1 retrolisthesis. Disc uncovering with disc bulge.
Unchanged superimposed right foraminal disc protrusion. New central
posterior annular fissure. Mild facet arthrosis. Mild relative right
subarticular and central canal narrowing without nerve root
impingement. Mild right inferior neural foraminal narrowing. As
before, the disc protrusion may contact the exiting right L3 nerve
root.

L4-L5: Grade 1 retrolisthesis. Minimal disc bulge. Mild facet
arthrosis. Minimal relative bilateral subarticular narrowing without
nerve root impingement. Central canal patent. No significant
foraminal stenosis.

L5-S1: Grade 1 retrolisthesis. Post laminectomy changes, new from
the prior MRI. Disc bulge with endplate spurring. Residual/recurrent
broad-based central/right subarticular disc protrusion, now with
mild caudal migration. Mild facet arthrosis. New from the prior
exam, there is a circumscribed fluid collection within the right
extradural spinal canal measuring 1.3 x 0.7 cm (for instance as seen
on series 10, images 22 in 23). There is multifactorial severe right
subarticular stenosis with potential to affect the descending right
S1 nerve root. Multifactorial moderate to moderately severe spinal
canal stenosis, progressed. Also new from the prior exam, there has
been interval progression of a right foraminal disc protrusion at
site of posterior annular fissure which contributes to mild right
neural foraminal narrowing and may contact the exiting right L5
nerve root.

S1-S2: Left center posterior annular fissure. Minimal disc bulge
with endplate spurring. No significant spinal canal or foraminal
stenosis.

Impressions #2 and #3 below will be called to the ordering clinician
or representative by the Radiologist Assistant, and communication
documented in the PACS or [REDACTED].
IMPRESSION: Transitional lumbosacral anatomy as described. Spinal numbering will
remain consistent with that utilized on the prior lumbar spine MRI
of [DATE].

At L5-S1, post laminectomy changes are new from the prior MRI.
Residual/recurrent broad-based central/right subarticular disc
protrusion now with mild caudal migration. A 1.3 x 0.7 cm
circumscribed fluid collection within the right extradural spinal
canal is also new, and this may reflect a synovial facet cyst or
extension of the paraspinal fluid collection described below. The
disc protrusion and fluid collection contribute to multifactorial
progressive severe right subarticular stenosis with potential to
affect the descending right S1 nerve root. There is also progressive
multifactorial moderate to moderately severe central canal stenosis.
Additionally, there has been interval progression of a right
foraminal disc protrusion which contributes to mild right neural
foraminal narrowing and may contact the exiting right L5 nerve root.

5.3 x 4.5 x 5.2 cm peripherally enhancing fluid collection within
the dorsal soft tissues extending from the L5-S1 laminectomy sites.
This is a nonspecific finding. Correlate to exclude any signs or
symptoms that would suggest infection/abscess. Additionally
correlate for possible CSF leak.

At L3-L4, a right foraminal disc protrusion contributes to mild
right neural foraminal narrowing and may contact the exiting right
L3 nerve root. These findings are unchanged

## 2020-02-21 MED ORDER — GADOBUTROL 1 MMOL/ML IV SOLN
9.0000 mL | Freq: Once | INTRAVENOUS | Status: AC | PRN
  Administered 2020-02-21: 9 mL via INTRAVENOUS

## 2020-02-21 NOTE — Progress Notes (Signed)
Called pt for pre-op call. Call went to voicemail. Left message for pt to return my call but also instructed her that if she is still in [place], to please go get a Covid test done by 3 PM. I left the address to the Covid Testing site on pt's voicemail.

## 2020-02-21 NOTE — Progress Notes (Signed)
Pt returned my call. Pt had already left [place] when she got my message. Covid test will be done on arrival in the AM.  Pt denies cardiac history, HTN or Diabetes.

## 2020-02-22 ENCOUNTER — Inpatient Hospital Stay (HOSPITAL_COMMUNITY): Payer: Self-pay | Admitting: Anesthesiology

## 2020-02-22 ENCOUNTER — Encounter (HOSPITAL_COMMUNITY)
Admission: RE | Disposition: A | Discharge status: Home / Self Care | Payer: Self-pay | Source: Home / Self Care | Attending: Neurosurgery

## 2020-02-22 ENCOUNTER — Inpatient Hospital Stay (HOSPITAL_COMMUNITY)
Admission: RE | Admit: 2020-02-22 | Discharge: 2020-02-26 | DRG: 030 | Disposition: A | Discharge status: Home / Self Care | Payer: Self-pay | Attending: Neurosurgery | Admitting: Neurosurgery

## 2020-02-22 ENCOUNTER — Encounter (HOSPITAL_COMMUNITY): Payer: Self-pay | Admitting: Neurosurgery

## 2020-02-22 DIAGNOSIS — E785 Hyperlipidemia, unspecified: Secondary | ICD-10-CM | POA: Diagnosis present

## 2020-02-22 DIAGNOSIS — Z79899 Other long term (current) drug therapy: Secondary | ICD-10-CM

## 2020-02-22 DIAGNOSIS — G96198 Other disorders of meninges, not elsewhere classified: Secondary | ICD-10-CM | POA: Diagnosis present

## 2020-02-22 DIAGNOSIS — G8929 Other chronic pain: Secondary | ICD-10-CM | POA: Diagnosis present

## 2020-02-22 DIAGNOSIS — R519 Headache, unspecified: Secondary | ICD-10-CM | POA: Diagnosis present

## 2020-02-22 DIAGNOSIS — Z90710 Acquired absence of both cervix and uterus: Secondary | ICD-10-CM

## 2020-02-22 DIAGNOSIS — M4316 Spondylolisthesis, lumbar region: Secondary | ICD-10-CM | POA: Diagnosis present

## 2020-02-22 DIAGNOSIS — Z9049 Acquired absence of other specified parts of digestive tract: Secondary | ICD-10-CM

## 2020-02-22 DIAGNOSIS — F17210 Nicotine dependence, cigarettes, uncomplicated: Secondary | ICD-10-CM | POA: Diagnosis present

## 2020-02-22 DIAGNOSIS — M5441 Lumbago with sciatica, right side: Secondary | ICD-10-CM | POA: Diagnosis present

## 2020-02-22 DIAGNOSIS — M48062 Spinal stenosis, lumbar region with neurogenic claudication: Secondary | ICD-10-CM | POA: Diagnosis present

## 2020-02-22 DIAGNOSIS — M47816 Spondylosis without myelopathy or radiculopathy, lumbar region: Secondary | ICD-10-CM | POA: Diagnosis present

## 2020-02-22 DIAGNOSIS — K219 Gastro-esophageal reflux disease without esophagitis: Secondary | ICD-10-CM | POA: Diagnosis present

## 2020-02-22 DIAGNOSIS — Z20822 Contact with and (suspected) exposure to covid-19: Secondary | ICD-10-CM | POA: Diagnosis present

## 2020-02-22 DIAGNOSIS — R030 Elevated blood-pressure reading, without diagnosis of hypertension: Secondary | ICD-10-CM | POA: Diagnosis present

## 2020-02-22 DIAGNOSIS — G9600 Cerebrospinal fluid leak, unspecified: Principal | ICD-10-CM | POA: Diagnosis present

## 2020-02-22 DIAGNOSIS — M4120 Other idiopathic scoliosis, site unspecified: Secondary | ICD-10-CM | POA: Diagnosis present

## 2020-02-22 HISTORY — PX: REPAIR OF CEREBROSPINAL FLUID LEAK: SHX6322

## 2020-02-22 HISTORY — DX: Unspecified osteoarthritis, unspecified site: M19.90

## 2020-02-22 LAB — CBC
HCT: 43.5 % (ref 36.0–46.0)
Hemoglobin: 13.7 g/dL (ref 12.0–15.0)
MCH: 30.5 pg (ref 26.0–34.0)
MCHC: 31.5 g/dL (ref 30.0–36.0)
MCV: 96.9 fL (ref 80.0–100.0)
Platelets: 314 10*3/uL (ref 150–400)
RBC: 4.49 MIL/uL (ref 3.87–5.11)
RDW: 12.1 % (ref 11.5–15.5)
WBC: 9.9 10*3/uL (ref 4.0–10.5)
nRBC: 0 % (ref 0.0–0.2)

## 2020-02-22 LAB — GLUCOSE, CAPILLARY: Glucose-Capillary: 136 mg/dL — ABNORMAL HIGH (ref 70–99)

## 2020-02-22 LAB — SARS CORONAVIRUS 2 BY RT PCR (HOSPITAL ORDER, PERFORMED IN ~~LOC~~ HOSPITAL LAB): SARS Coronavirus 2: NEGATIVE

## 2020-02-22 SURGERY — REPAIR OF CEREBROSPINAL FLUID LEAK
Anesthesia: General

## 2020-02-22 MED ORDER — ONDANSETRON HCL 4 MG/2ML IJ SOLN
INTRAMUSCULAR | Status: AC
  Filled 2020-02-22: qty 2

## 2020-02-22 MED ORDER — PHENYLEPHRINE 40 MCG/ML (10ML) SYRINGE FOR IV PUSH (FOR BLOOD PRESSURE SUPPORT)
PREFILLED_SYRINGE | INTRAVENOUS | Status: DC | PRN
  Administered 2020-02-22: 80 ug via INTRAVENOUS

## 2020-02-22 MED ORDER — THROMBIN 5000 UNITS EX SOLR
CUTANEOUS | Status: AC
  Filled 2020-02-22: qty 5000

## 2020-02-22 MED ORDER — SODIUM CHLORIDE 0.9% FLUSH
3.0000 mL | INTRAVENOUS | Status: DC | PRN
  Administered 2020-02-23: 3 mL via INTRAVENOUS

## 2020-02-22 MED ORDER — CEFAZOLIN SODIUM-DEXTROSE 2-4 GM/100ML-% IV SOLN
2.0000 g | Freq: Three times a day (TID) | INTRAVENOUS | Status: AC
  Administered 2020-02-22 – 2020-02-23 (×2): 2 g via INTRAVENOUS
  Filled 2020-02-22 (×2): qty 100

## 2020-02-22 MED ORDER — ASPIRIN 81 MG PO CHEW
81.0000 mg | CHEWABLE_TABLET | Freq: Every day | ORAL | Status: DC
  Administered 2020-02-22 – 2020-02-26 (×5): 81 mg via ORAL
  Filled 2020-02-22 (×4): qty 1

## 2020-02-22 MED ORDER — MIDAZOLAM HCL 5 MG/5ML IJ SOLN
INTRAMUSCULAR | Status: DC | PRN
  Administered 2020-02-22: 2 mg via INTRAVENOUS

## 2020-02-22 MED ORDER — ONDANSETRON HCL 4 MG/2ML IJ SOLN: 4.0000 mg | Freq: Four times a day (QID) | INTRAMUSCULAR | Status: DC | PRN

## 2020-02-22 MED ORDER — MIDAZOLAM HCL 2 MG/2ML IJ SOLN
INTRAMUSCULAR | Status: AC
  Filled 2020-02-22: qty 2

## 2020-02-22 MED ORDER — CHLORHEXIDINE GLUCONATE 0.12 % MT SOLN
15.0000 mL | Freq: Once | OROMUCOSAL | Status: AC
  Administered 2020-02-22: 15 mL via OROMUCOSAL
  Filled 2020-02-22: qty 15

## 2020-02-22 MED ORDER — LIDOCAINE-EPINEPHRINE 1 %-1:100000 IJ SOLN
INTRAMUSCULAR | Status: DC | PRN
  Administered 2020-02-22: 5 mL

## 2020-02-22 MED ORDER — HYDROMORPHONE HCL 1 MG/ML IJ SOLN: 0.5000 mg | INTRAMUSCULAR | Status: DC | PRN

## 2020-02-22 MED ORDER — PROPOFOL 10 MG/ML IV BOLUS
INTRAVENOUS | Status: AC
  Filled 2020-02-22: qty 20

## 2020-02-22 MED ORDER — METHOCARBAMOL 500 MG PO TABS
500.0000 mg | ORAL_TABLET | Freq: Four times a day (QID) | ORAL | Status: DC | PRN
  Administered 2020-02-22: 500 mg via ORAL

## 2020-02-22 MED ORDER — HYDROMORPHONE HCL 1 MG/ML IJ SOLN
0.2500 mg | INTRAMUSCULAR | Status: DC | PRN
  Administered 2020-02-22 (×2): 0.5 mg via INTRAVENOUS

## 2020-02-22 MED ORDER — CHLORHEXIDINE GLUCONATE CLOTH 2 % EX PADS
6.0000 | MEDICATED_PAD | Freq: Once | CUTANEOUS | Status: AC
  Administered 2020-02-22: 6 via TOPICAL

## 2020-02-22 MED ORDER — HYDROCODONE-ACETAMINOPHEN 5-325 MG PO TABS
1.0000 | ORAL_TABLET | ORAL | Status: DC | PRN
Start: 2020-02-22 — End: 2020-02-26
  Administered 2020-02-23 – 2020-02-25 (×3): 1 via ORAL
  Filled 2020-02-22 (×4): qty 1

## 2020-02-22 MED ORDER — FENTANYL CITRATE (PF) 250 MCG/5ML IJ SOLN
INTRAMUSCULAR | Status: AC
  Filled 2020-02-22: qty 5

## 2020-02-22 MED ORDER — LIDOCAINE-EPINEPHRINE 1 %-1:100000 IJ SOLN
INTRAMUSCULAR | Status: AC
  Filled 2020-02-22: qty 1

## 2020-02-22 MED ORDER — KETOROLAC TROMETHAMINE 15 MG/ML IJ SOLN
15.0000 mg | Freq: Four times a day (QID) | INTRAMUSCULAR | Status: AC
  Administered 2020-02-22 – 2020-02-23 (×4): 15 mg via INTRAVENOUS
  Filled 2020-02-22 (×4): qty 1

## 2020-02-22 MED ORDER — ACETAMINOPHEN 325 MG PO TABS: 650.0000 mg | ORAL_TABLET | ORAL | Status: DC | PRN

## 2020-02-22 MED ORDER — METHOCARBAMOL 1000 MG/10ML IJ SOLN
500.0000 mg | Freq: Four times a day (QID) | INTRAVENOUS | Status: DC | PRN
  Filled 2020-02-22: qty 5

## 2020-02-22 MED ORDER — LIDOCAINE 2% (20 MG/ML) 5 ML SYRINGE
INTRAMUSCULAR | Status: AC
  Filled 2020-02-22: qty 5

## 2020-02-22 MED ORDER — PROMETHAZINE HCL 25 MG/ML IJ SOLN: 6.2500 mg | INTRAMUSCULAR | Status: DC | PRN

## 2020-02-22 MED ORDER — THROMBIN 5000 UNITS EX SOLR
OROMUCOSAL | Status: DC | PRN
  Administered 2020-02-22: 5 mL via TOPICAL

## 2020-02-22 MED ORDER — KETOROLAC TROMETHAMINE 30 MG/ML IJ SOLN: 30.0000 mg | Freq: Once | INTRAMUSCULAR | Status: DC | PRN

## 2020-02-22 MED ORDER — LACTATED RINGERS IV SOLN: INTRAVENOUS | Status: DC

## 2020-02-22 MED ORDER — CHLORHEXIDINE GLUCONATE CLOTH 2 % EX PADS
6.0000 | MEDICATED_PAD | Freq: Every day | CUTANEOUS | Status: DC
  Administered 2020-02-23 – 2020-02-25 (×3): 6 via TOPICAL

## 2020-02-22 MED ORDER — MENTHOL 3 MG MT LOZG: 1.0000 | LOZENGE | OROMUCOSAL | Status: DC | PRN

## 2020-02-22 MED ORDER — ZOLPIDEM TARTRATE 5 MG PO TABS: 5.0000 mg | ORAL_TABLET | Freq: Every evening | ORAL | Status: DC | PRN

## 2020-02-22 MED ORDER — BUPIVACAINE HCL (PF) 0.5 % IJ SOLN
INTRAMUSCULAR | Status: DC | PRN
  Administered 2020-02-22: 5 mL

## 2020-02-22 MED ORDER — ALUM & MAG HYDROXIDE-SIMETH 200-200-20 MG/5ML PO SUSP: 30.0000 mL | Freq: Four times a day (QID) | ORAL | Status: DC | PRN

## 2020-02-22 MED ORDER — FLEET ENEMA 7-19 GM/118ML RE ENEM: 1.0000 | ENEMA | Freq: Once | RECTAL | Status: DC | PRN

## 2020-02-22 MED ORDER — ONDANSETRON HCL 4 MG/2ML IJ SOLN
INTRAMUSCULAR | Status: DC | PRN
  Administered 2020-02-22: 4 mg via INTRAVENOUS

## 2020-02-22 MED ORDER — FENTANYL CITRATE (PF) 100 MCG/2ML IJ SOLN
INTRAMUSCULAR | Status: DC | PRN
  Administered 2020-02-22: 100 ug via INTRAVENOUS
  Administered 2020-02-22: 50 ug via INTRAVENOUS

## 2020-02-22 MED ORDER — POLYETHYLENE GLYCOL 3350 17 G PO PACK: 17.0000 g | PACK | Freq: Every day | ORAL | Status: DC | PRN

## 2020-02-22 MED ORDER — ROCURONIUM BROMIDE 10 MG/ML (PF) SYRINGE
PREFILLED_SYRINGE | INTRAVENOUS | Status: AC
  Filled 2020-02-22: qty 10

## 2020-02-22 MED ORDER — DOCUSATE SODIUM 100 MG PO CAPS
100.0000 mg | ORAL_CAPSULE | Freq: Two times a day (BID) | ORAL | Status: DC
  Administered 2020-02-22 – 2020-02-25 (×6): 100 mg via ORAL
  Filled 2020-02-22 (×7): qty 1

## 2020-02-22 MED ORDER — ONDANSETRON HCL 4 MG PO TABS: 4.0000 mg | ORAL_TABLET | Freq: Four times a day (QID) | ORAL | Status: DC | PRN

## 2020-02-22 MED ORDER — ACETAMINOPHEN 650 MG RE SUPP: 650.0000 mg | RECTAL | Status: DC | PRN

## 2020-02-22 MED ORDER — DEXAMETHASONE SODIUM PHOSPHATE 10 MG/ML IJ SOLN
INTRAMUSCULAR | Status: AC
  Filled 2020-02-22: qty 1

## 2020-02-22 MED ORDER — PROPOFOL 10 MG/ML IV BOLUS
INTRAVENOUS | Status: AC
  Filled 2020-02-22: qty 40

## 2020-02-22 MED ORDER — SODIUM CHLORIDE 0.9 % IV SOLN
250.0000 mL | INTRAVENOUS | Status: DC
  Administered 2020-02-22: 250 mL via INTRAVENOUS

## 2020-02-22 MED ORDER — SUGAMMADEX SODIUM 200 MG/2ML IV SOLN
INTRAVENOUS | Status: DC | PRN
  Administered 2020-02-22: 200 mg via INTRAVENOUS

## 2020-02-22 MED ORDER — AMITRIPTYLINE HCL 25 MG PO TABS
25.0000 mg | ORAL_TABLET | Freq: Every day | ORAL | Status: DC
  Administered 2020-02-23 – 2020-02-25 (×3): 25 mg via ORAL
  Filled 2020-02-22 (×4): qty 1

## 2020-02-22 MED ORDER — SODIUM CHLORIDE 0.9% FLUSH
3.0000 mL | Freq: Two times a day (BID) | INTRAVENOUS | Status: DC
  Administered 2020-02-22 – 2020-02-26 (×7): 3 mL via INTRAVENOUS

## 2020-02-22 MED ORDER — SCOPOLAMINE 1 MG/3DAYS TD PT72
MEDICATED_PATCH | TRANSDERMAL | Status: AC
  Filled 2020-02-22: qty 1

## 2020-02-22 MED ORDER — KCL IN DEXTROSE-NACL 20-5-0.45 MEQ/L-%-% IV SOLN
INTRAVENOUS | Status: DC
  Filled 2020-02-22 (×3): qty 1000

## 2020-02-22 MED ORDER — PHENOL 1.4 % MT LIQD: 1.0000 | OROMUCOSAL | Status: DC | PRN

## 2020-02-22 MED ORDER — VASOPRESSIN 20 UNIT/ML IV SOLN
INTRAVENOUS | Status: AC
  Filled 2020-02-22: qty 1

## 2020-02-22 MED ORDER — ROCURONIUM BROMIDE 10 MG/ML (PF) SYRINGE
PREFILLED_SYRINGE | INTRAVENOUS | Status: DC | PRN
  Administered 2020-02-22: 70 mg via INTRAVENOUS

## 2020-02-22 MED ORDER — PROPOFOL 10 MG/ML IV BOLUS
INTRAVENOUS | Status: DC | PRN
  Administered 2020-02-22: 100 mg via INTRAVENOUS
  Administered 2020-02-22: 60 mg via INTRAVENOUS

## 2020-02-22 MED ORDER — PANTOPRAZOLE SODIUM 40 MG IV SOLR: 40.0000 mg | Freq: Every day | INTRAVENOUS | Status: DC

## 2020-02-22 MED ORDER — HYDROCODONE-ACETAMINOPHEN 5-325 MG PO TABS
2.0000 | ORAL_TABLET | ORAL | Status: DC | PRN
Start: 2020-02-22 — End: 2020-02-26
  Administered 2020-02-22 – 2020-02-24 (×3): 2 via ORAL
  Filled 2020-02-22 (×3): qty 2

## 2020-02-22 MED ORDER — METHOCARBAMOL 500 MG PO TABS: 1000.0000 mg | ORAL_TABLET | Freq: Every day | ORAL | Status: DC | PRN

## 2020-02-22 MED ORDER — DEXAMETHASONE SODIUM PHOSPHATE 4 MG/ML IJ SOLN
INTRAMUSCULAR | Status: DC | PRN
  Administered 2020-02-22: 10 mg via INTRAVENOUS

## 2020-02-22 MED ORDER — PANTOPRAZOLE SODIUM 40 MG PO TBEC
40.0000 mg | DELAYED_RELEASE_TABLET | Freq: Every day | ORAL | Status: DC
  Administered 2020-02-22 – 2020-02-26 (×5): 40 mg via ORAL
  Filled 2020-02-22 (×4): qty 1

## 2020-02-22 MED ORDER — 0.9 % SODIUM CHLORIDE (POUR BTL) OPTIME
TOPICAL | Status: DC | PRN
  Administered 2020-02-22: 1000 mL

## 2020-02-22 MED ORDER — BISACODYL 10 MG RE SUPP: 10.0000 mg | Freq: Every day | RECTAL | Status: DC | PRN

## 2020-02-22 MED ORDER — HYDROCODONE-ACETAMINOPHEN 5-325 MG PO TABS: 1.0000 | ORAL_TABLET | Freq: Four times a day (QID) | ORAL | Status: DC | PRN

## 2020-02-22 MED ORDER — ORAL CARE MOUTH RINSE: 15.0000 mL | Freq: Once | OROMUCOSAL | Status: AC

## 2020-02-22 MED ORDER — METHOCARBAMOL 500 MG PO TABS
ORAL_TABLET | ORAL | Status: AC
  Filled 2020-02-22: qty 1

## 2020-02-22 MED ORDER — CEFAZOLIN SODIUM 1 G IJ SOLR
INTRAMUSCULAR | Status: AC
  Filled 2020-02-22: qty 20

## 2020-02-22 MED ORDER — HYDROMORPHONE HCL 1 MG/ML IJ SOLN
INTRAMUSCULAR | Status: AC
  Filled 2020-02-22: qty 1

## 2020-02-22 MED ORDER — BUPIVACAINE HCL (PF) 0.5 % IJ SOLN
INTRAMUSCULAR | Status: AC
  Filled 2020-02-22: qty 30

## 2020-02-22 MED ORDER — CEFAZOLIN SODIUM-DEXTROSE 2-4 GM/100ML-% IV SOLN: 2.0000 g | INTRAVENOUS | Status: DC

## 2020-02-22 MED ORDER — CEFAZOLIN SODIUM-DEXTROSE 2-3 GM-%(50ML) IV SOLR
INTRAVENOUS | Status: DC | PRN
  Administered 2020-02-22: 2 g via INTRAVENOUS

## 2020-02-22 MED ORDER — ENOXAPARIN SODIUM 40 MG/0.4ML ~~LOC~~ SOLN
40.0000 mg | SUBCUTANEOUS | Status: DC
  Administered 2020-02-22 – 2020-02-25 (×4): 40 mg via SUBCUTANEOUS
  Filled 2020-02-22 (×4): qty 0.4

## 2020-02-22 MED ORDER — ATORVASTATIN CALCIUM 40 MG PO TABS
40.0000 mg | ORAL_TABLET | Freq: Every day | ORAL | Status: DC
  Administered 2020-02-22 – 2020-02-25 (×4): 40 mg via ORAL
  Filled 2020-02-22 (×3): qty 1

## 2020-02-22 MED ORDER — LIDOCAINE 2% (20 MG/ML) 5 ML SYRINGE
INTRAMUSCULAR | Status: DC | PRN
  Administered 2020-02-22: 100 mg via INTRAVENOUS

## 2020-02-22 SURGICAL SUPPLY — 53 items
ADH SKN CLS APL DERMABOND .7 (GAUZE/BANDAGES/DRESSINGS) ×1
BAND INSRT 18 STRL LF DISP RB (MISCELLANEOUS) ×2
BAND RUBBER #18 3X1/16 STRL (MISCELLANEOUS) ×2 IMPLANT
CANISTER SUCT 3000ML PPV (MISCELLANEOUS) ×2 IMPLANT
CARTRIDGE OIL MAESTRO DRILL (MISCELLANEOUS) ×1 IMPLANT
COVER WAND RF STERILE (DRAPES) ×2 IMPLANT
DECANTER SPIKE VIAL GLASS SM (MISCELLANEOUS) ×2 IMPLANT
DERMABOND ADVANCED (GAUZE/BANDAGES/DRESSINGS) ×1
DERMABOND ADVANCED .7 DNX12 (GAUZE/BANDAGES/DRESSINGS) IMPLANT
DIFFUSER DRILL AIR PNEUMATIC (MISCELLANEOUS) ×2 IMPLANT
DRAPE LAPAROTOMY 100X72X124 (DRAPES) ×2 IMPLANT
DRAPE MICROSCOPE LEICA (MISCELLANEOUS) ×1 IMPLANT
DRAPE SURG 17X23 STRL (DRAPES) ×2 IMPLANT
DRSG OPSITE POSTOP 4X6 (GAUZE/BANDAGES/DRESSINGS) ×1 IMPLANT
DURAPREP 26ML APPLICATOR (WOUND CARE) ×2 IMPLANT
DURASEAL SPINE SEALANT 3ML (MISCELLANEOUS) ×1 IMPLANT
ELECT REM PT RETURN 9FT ADLT (ELECTROSURGICAL) ×2
ELECTRODE REM PT RTRN 9FT ADLT (ELECTROSURGICAL) ×1 IMPLANT
GAUZE 4X4 16PLY RFD (DISPOSABLE) IMPLANT
GAUZE SPONGE 4X4 12PLY STRL (GAUZE/BANDAGES/DRESSINGS) IMPLANT
GLOVE BIO SURGEON STRL SZ8 (GLOVE) ×3 IMPLANT
GLOVE BIOGEL PI IND STRL 8 (GLOVE) ×1 IMPLANT
GLOVE BIOGEL PI IND STRL 8.5 (GLOVE) ×1 IMPLANT
GLOVE BIOGEL PI INDICATOR 8 (GLOVE) ×1
GLOVE BIOGEL PI INDICATOR 8.5 (GLOVE) ×1
GLOVE ECLIPSE 7.5 STRL STRAW (GLOVE) ×4 IMPLANT
GLOVE ECLIPSE 8.0 STRL XLNG CF (GLOVE) ×2 IMPLANT
GLOVE EXAM NITRILE XL STR (GLOVE) IMPLANT
GOWN STRL REUS W/ TWL LRG LVL3 (GOWN DISPOSABLE) IMPLANT
GOWN STRL REUS W/ TWL XL LVL3 (GOWN DISPOSABLE) IMPLANT
GOWN STRL REUS W/TWL 2XL LVL3 (GOWN DISPOSABLE) ×2 IMPLANT
GOWN STRL REUS W/TWL LRG LVL3 (GOWN DISPOSABLE)
GOWN STRL REUS W/TWL XL LVL3 (GOWN DISPOSABLE) ×4
KIT BASIN OR (CUSTOM PROCEDURE TRAY) ×2 IMPLANT
KIT TURNOVER KIT B (KITS) ×2 IMPLANT
NDL HYPO 25X5/8 SAFETYGLIDE (NEEDLE) ×1 IMPLANT
NEEDLE HYPO 25X5/8 SAFETYGLIDE (NEEDLE) ×2 IMPLANT
NS IRRIG 1000ML POUR BTL (IV SOLUTION) ×2 IMPLANT
OIL CARTRIDGE MAESTRO DRILL (MISCELLANEOUS)
PACK LAMINECTOMY NEURO (CUSTOM PROCEDURE TRAY) ×2 IMPLANT
PAD ARMBOARD 7.5X6 YLW CONV (MISCELLANEOUS) ×1 IMPLANT
PATTIES SURGICAL 1/4 X 3 (GAUZE/BANDAGES/DRESSINGS) ×1 IMPLANT
SEALANT ADHERUS EXTEND TIP (MISCELLANEOUS) ×3 IMPLANT
SPONGE SURGIFOAM ABS GEL SZ50 (HEMOSTASIS) IMPLANT
STAPLER SKIN PROX WIDE 3.9 (STAPLE) IMPLANT
SUT PROLENE 6 0 BV (SUTURE) ×3 IMPLANT
SUT VIC AB 0 CT1 18XCR BRD8 (SUTURE) ×1 IMPLANT
SUT VIC AB 0 CT1 8-18 (SUTURE) ×2
SUT VIC AB 2-0 CT1 18 (SUTURE) ×2 IMPLANT
SUT VIC AB 3-0 SH 8-18 (SUTURE) ×2 IMPLANT
TOWEL GREEN STERILE (TOWEL DISPOSABLE) ×2 IMPLANT
TOWEL GREEN STERILE FF (TOWEL DISPOSABLE) ×2 IMPLANT
WATER STERILE IRR 1000ML POUR (IV SOLUTION) ×2 IMPLANT

## 2020-02-22 NOTE — Brief Op Note (Signed)
02/22/2020  9:45 AM  PATIENT:  Jeanne Fisher  54 y.o. female  PRE-OPERATIVE DIAGNOSIS:  Cerebrospinal fluid leak  POST-OPERATIVE DIAGNOSIS:  Cerebrospinal fluid leak  PROCEDURE:  Procedure(s): Exploration of Lumbar surgical wound with  cerebrospinal fluid leak repair (N/A)  SURGEON:  Surgeon(s) and Role:    * Gigi Onstad, MD - Primary  PHYSICIAN ASSISTANT: McDaniel, NP  ASSISTANTS: Poteat, RN   ANESTHESIA:   general  EBL: Minimal  BLOOD ADMINISTERED:none  DRAINS: none   LOCAL MEDICATIONS USED:  MARCAINE    and LIDOCAINE   SPECIMEN:  No Specimen  DISPOSITION OF SPECIMEN:  N/A  COUNTS:  YES  TOURNIQUET:  * No tourniquets in log *  DICTATION: Indications:  Patient underwent uncomplicated bilateral lumbar laminectomy L 45 one month ago, from which she did well.  At approximately week 4 post op, patient developed postural headaches and leaked clear fluid from CSF leak. MRI confirmed CSF leak.  S he was brought to OR for wound exploration and repair of CSF leak.  Procedure:  Patient was brought to OR and intubated,  then placed prone on Wilson frame.   Back was prepped and draped in usual fashion with betadine scrub and Duraprep.  Prior incision was reopened and laminectomy defect identified.  There was no leakage on the left.  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  There was a small hole in the arachnoid layer and CSF was leaking.  We enlarged the bony defect.  The dura, especially towards the midline was extremely thin and fragile.   A single 6-0 Prolene suture was placed.  CSF flow was greatly dimished, but not entirely resolved as demonstrated with Valsalva.  I did not feel it would be possible to place another stitch, due to the atretic nature of the dura.  We then placed Duraseal over the dural defect.   Wound was closed with 0, 2-0, 3-0 Nylon stitches followed by Dermabond and a sterile  occlusive dressing.  A Foley catheter was placed for anticipated flat bedrest.  Patient was taken to Recovery having tolerated her procedure well.  PLAN OF CARE: Admit to inpatient   PATIENT DISPOSITION:  PACU - hemodynamically stable.   Delay start of Pharmacological VTE agent (>24hrs) due to surgical blood loss or risk of bleeding: yes  

## 2020-02-22 NOTE — Interval H&P Note (Signed)
History and Physical Interval Note:  02/22/2020 7:30 AM  Jeanne Fisher  has presented today for surgery, with the diagnosis of Cerebrospinal fluid leak.  The various methods of treatment have been discussed with the patient and family. After consideration of risks, benefits and other options for treatment, the patient has consented to  Procedure(s) with comments: Exploration of Lumbar surgical wound with possible cerebrospinal fluid leak repair (N/A) - 3C as a surgical intervention.  The patient's history has been reviewed, patient examined, no change in status, stable for surgery.  I have reviewed the patient's chart and labs.  Questions were answered to the patient's satisfaction.     Dorian Heckle

## 2020-02-22 NOTE — Op Note (Signed)
02/22/2020  9:45 AM  PATIENT:  Roger Kill Lesage  55 y.o. female  PRE-OPERATIVE DIAGNOSIS:  Cerebrospinal fluid leak  POST-OPERATIVE DIAGNOSIS:  Cerebrospinal fluid leak  PROCEDURE:  Procedure(s): Exploration of Lumbar surgical wound with  cerebrospinal fluid leak repair (N/A)  SURGEON:  Surgeon(s) and Role:    Maeola Harman, MD - Primary  PHYSICIAN ASSISTANT: Julien Girt, NP  ASSISTANTS: Poteat, RN   ANESTHESIA:   general  EBL: Minimal  BLOOD ADMINISTERED:none  DRAINS: none   LOCAL MEDICATIONS USED:  MARCAINE    and LIDOCAINE   SPECIMEN:  No Specimen  DISPOSITION OF SPECIMEN:  N/A  COUNTS:  YES  TOURNIQUET:  * No tourniquets in log *  DICTATION: Indications:  Patient underwent uncomplicated bilateral lumbar laminectomy L 45 one month ago, from which she did well.  At approximately week 4 post op, patient developed postural headaches and leaked clear fluid from CSF leak. MRI confirmed CSF leak.  S he was brought to OR for wound exploration and repair of CSF leak.  Procedure:  Patient was brought to OR and intubated,  then placed prone on Wilson frame.   Back was prepped and draped in usual fashion with betadine scrub and Duraprep.  Prior incision was reopened and laminectomy defect identified.  There was no leakage on the left.  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  There was a small hole in the arachnoid layer and CSF was leaking.  We enlarged the bony defect.  The dura, especially towards the midline was extremely thin and fragile.   A single 6-0 Prolene suture was placed.  CSF flow was greatly dimished, but not entirely resolved as demonstrated with Valsalva.  I did not feel it would be possible to place another stitch, due to the atretic nature of the dura.  We then placed Duraseal over the dural defect.   Wound was closed with 0, 2-0, 3-0 Nylon stitches followed by Dermabond and a sterile  occlusive dressing.  A Foley catheter was placed for anticipated flat bedrest.  Patient was taken to Recovery having tolerated her procedure well.  PLAN OF CARE: Admit to inpatient   PATIENT DISPOSITION:  PACU - hemodynamically stable.   Delay start of Pharmacological VTE agent (>24hrs) due to surgical blood loss or risk of bleeding: yes

## 2020-02-22 NOTE — Progress Notes (Addendum)
Patient ID: Jeanne Fisher, female   DOB: 1965-08-23, 55 y.o.   MRN: [id number] Pt alert and conversant transferring by staff to inpt bed for transport to 3W. No c/o pain at present, though reporting postop soreness upper back and ribs bilat. Discussed plan for flat HOB next 3 days (ok for logrolling/side-lying, knee bending) and explained purpose to minimize CSF pressure in lumbar region, allowing dural repair to heal. She verbalized understanding. Good strength BLE. Incision not assessed with this visit.
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
## 2020-02-22 NOTE — Progress Notes (Signed)
Patient is awake, alert, conversant.  MAEW with good power.  No leg pain or numbness.  Patient to lay flat x 72 hours for CSF leak.  D/w patient and her husband.  Patient is doing well.

## 2020-02-22 NOTE — Progress Notes (Signed)
Patient HR is sustained in 40s to 50s. Neurosurgery was notified and stated he is aware the patient HR has stayed within this range during this admission. Patient's breathing is even and unlabored, no chest pain or shortness of breathe has occurred. BP 117/48 map(67) HR 43, RR 18, Temp 98.6 F, will continue to monitor.

## 2020-02-22 NOTE — Transfer of Care (Signed)
Immediate Anesthesia Transfer of Care Note  Patient: Jeanne Fisher  Procedure(s) Performed: Exploration of Lumbar surgical wound with  cerebrospinal fluid leak repair (N/A )  Patient Location: PACU  Anesthesia Type:General  Level of Consciousness: awake  Airway & Oxygen Therapy: Patient Spontanous Breathing and Patient connected to face mask oxygen  Post-op Assessment: Report given to RN and Post -op Vital signs reviewed and stable  Post vital signs: Reviewed and stable  Last Vitals:  Vitals Value Taken Time  BP 140/51 02/22/20 0952  Temp    Pulse 80 02/22/20 0953  Resp 13 02/22/20 0953  SpO2 100 % 02/22/20 0953  Vitals shown include unvalidated device data.  Last Pain:  Vitals:   02/22/20 0644  TempSrc: Temporal  PainSc:       Patients Stated Pain Goal: 3 (02/22/20 0640)  Complications: No complications documented.

## 2020-02-22 NOTE — Anesthesia Preprocedure Evaluation (Signed)
Anesthesia Evaluation  Patient identified by MRN, date of birth, ID band Patient awake    Reviewed: Allergy & Precautions, H&P , NPO status , Patient's Chart, lab work & pertinent test results  Airway Mallampati: II  TM Distance: >3 FB Neck ROM: Full    Dental no notable dental hx.    Pulmonary neg pulmonary ROS, former smoker,    Pulmonary exam normal breath sounds clear to auscultation       Cardiovascular negative cardio ROS Normal cardiovascular exam Rhythm:Regular Rate:Normal     Neuro/Psych Anxiety negative neurological ROS     GI/Hepatic Neg liver ROS, GERD  ,  Endo/Other  negative endocrine ROS  Renal/GU negative Renal ROS  negative genitourinary   Musculoskeletal negative musculoskeletal ROS (+)   Abdominal   Peds negative pediatric ROS (+)  Hematology negative hematology ROS (+)   Anesthesia Other Findings   Reproductive/Obstetrics negative OB ROS                             Anesthesia Physical Anesthesia Plan  ASA: II  Anesthesia Plan: General   Post-op Pain Management:    Induction: Intravenous  PONV Risk Score and Plan: 3 and Ondansetron, Dexamethasone, Midazolam and Treatment may vary due to age or medical condition  Airway Management Planned: Oral ETT  Additional Equipment:   Intra-op Plan:   Post-operative Plan: Extubation in OR  Informed Consent: I have reviewed the patients History and Physical, chart, labs and discussed the procedure including the risks, benefits and alternatives for the proposed anesthesia with the patient or authorized representative who has indicated his/her understanding and acceptance.     Dental advisory given  Plan Discussed with: CRNA and Surgeon  Anesthesia Plan Comments:         Anesthesia Quick Evaluation

## 2020-02-22 NOTE — Progress Notes (Signed)
Jeanne Fisher is a 55 y.o. female patient transferred from PACU due to CFS fluid repair. Patient is  awake, alert  & orientated  X 4  Full Code, VSS - Blood pressure (!) 123/49, pulse (!) 48, temperature 97.8 F (36.6 C), resp. rate 18, height 5\' 7"  (1.702 m), weight 83 kg, SpO2 97 %.room air, no c/o shortness of breath, no c/o chest pain, no distress noted.     IV site WDL:   with a transparent dsg that's clean dry and intact.  Pt orientation to unit, room and routine. Information packet given to patient.  Admission INP armband ID verified with patient, and in place. SR up x 2, fall risk assessment complete with Patient and verbalizing understanding of risks associated with falls. Pt verbalizes an understanding of how to use the call bell and to call for help before getting out of bed.  Skin, clean-dry- intact without evidence of bruising, or skin tears. Patient has a surgical wound dressed in honey comb and dermabond. No evidence of skin break down noted on exam  Will cont to monitor and assist as needed.  , RN 02/22/2020 7:06 PM

## 2020-02-22 NOTE — Anesthesia Procedure Notes (Signed)
Procedure Name: Intubation Date/Time: 02/22/2020 8:19 AM Performed by: Caren Macadam, CRNA Pre-anesthesia Checklist: Patient identified, Emergency Drugs available, Suction available and Patient being monitored Patient Re-evaluated:Patient Re-evaluated prior to induction Oxygen Delivery Method: Circle system utilized Preoxygenation: Pre-oxygenation with 100% oxygen Induction Type: IV induction Ventilation: Mask ventilation without difficulty Laryngoscope Size: Miller and 2 Grade View: Grade II Tube type: Oral Tube size: 7.0 mm Number of attempts: 2 Airway Equipment and Method: Stylet Placement Confirmation: ETT inserted through vocal cords under direct vision,  positive ETCO2 and breath sounds checked- equal and bilateral Secured at: 24 cm Tube secured with: Tape Dental Injury: Teeth and Oropharynx as per pre-operative assessment

## 2020-02-22 NOTE — Anesthesia Postprocedure Evaluation (Signed)
Anesthesia Post Note  Patient: Jeanne Fisher  Procedure(s) Performed: Exploration of Lumbar surgical wound with  cerebrospinal fluid leak repair (N/A )     Patient location during evaluation: PACU Anesthesia Type: General Level of consciousness: awake and alert Pain management: pain level controlled Vital Signs Assessment: post-procedure vital signs reviewed and stable Respiratory status: spontaneous breathing, nonlabored ventilation, respiratory function stable and patient connected to nasal cannula oxygen Cardiovascular status: blood pressure returned to baseline and stable Postop Assessment: no apparent nausea or vomiting Anesthetic complications: no   No complications documented.  Last Vitals:  Vitals:   02/22/20 1040 02/22/20 1055  BP: (!) 131/48 (!) 128/48  Pulse: 75 68  Resp: 14 12  Temp:  36.7 C  SpO2: 99% 94%    Last Pain:  Vitals:   02/22/20 1055  TempSrc:   PainSc: 3                  [name] S

## 2020-02-23 ENCOUNTER — Encounter (HOSPITAL_COMMUNITY): Payer: Self-pay | Admitting: Neurosurgery

## 2020-02-23 NOTE — Plan of Care (Signed)

## 2020-02-23 NOTE — Progress Notes (Signed)
Subjective: Patient reports that she is doing well overall. No acute events reported overnight.   Objective: Vital signs in last 24 hours: Temp:  [97.2 F (36.2 C)-98.6 F (37 C)] 98 F (36.7 C) (01/05 0743) Pulse Rate:  [43-86] 50 (01/05 0743) Resp:  [9-20] 20 (01/05 0743) BP: (108-142)/(38-79) 119/38 (01/05 0743) SpO2:  [91 %-100 %] 99 % (01/05 0743)  Intake/Output from previous day: 01/04 0701 - 01/05 0700 In: 1716.5 [I.V.:1066.5; IV Piggyback:200] Out: 900 [Urine:900] Intake/Output this shift: Total I/O In: 300 [I.V.:300] Out: -   Physical Exam: Patient is supine with HOB flat. She is awake, A/O X 4, conversant, and in good spirits. Speech is fluent and appropriate. Doing well. MAEW with good strength that is symmetric bilaterally. 5/5 BUE/BLE. Dressing is clean dry intact. Incision is well approximated with no drainage, erythema, or edema.   Lab Results: Recent Labs    02/22/20 0554  WBC 9.9  HGB 13.7  HCT 43.5  PLT 314   BMET No results for input(s): NA, K, CL, CO2, GLUCOSE, BUN, CREATININE, CALCIUM in the last 72 hours.  Studies/Results: MR Lumbar Spine W Wo Contrast  Result Date: 02/21/2020 CLINICAL DATA:  Neurogenic claudication due to lumbar spinal stenosis. Additional history provided: Patient reports lumbar surgery 4 weeks ago, CSF leak. EXAM: MRI LUMBAR SPINE WITHOUT AND WITH CONTRAST TECHNIQUE: Multiplanar and multiecho pulse sequences of the lumbar spine were obtained without and with intravenous contrast. CONTRAST:  34mL GADAVIST GADOBUTROL 1 MMOL/ML IV SOLN COMPARISON:  Lumbar spine MRI 09/01/2019. Lumbar spine radiographs 12/01/2019. FINDINGS: Segmentation: Redemonstrated transitional lumbosacral anatomy. Spinal numbering will remain consistent with that utilized on the prior lumbar spine MRI of 09/01/2019. By this numbering scheme, the S1 level is partially lumbarized and there is a relatively well-formed S1-S2 interspace. Alignment: 2 mm grade 1  retrolisthesis at L2-L3, L3-L4, L4-L5 and L5-S1. Vertebrae: Trace degenerative endplate edema and enhancement at L3-L4. No significant marrow edema identified elsewhere. No focal suspicious osseous lesion. Conus medullaris and cauda equina: Conus extends to the L1 level. No signal abnormality within the visualized distal spinal cord. No abnormal enhancement of the visualized distal spinal cord or cauda equina nerve roots. Paraspinal and other soft tissues: New from the prior MRI of 09/01/2019, there is a nonspecific peripherally enhancing fluid collection measuring 5.3 x 4.5 x 5.2 cm extending from the L5-S1 laminectomy sites and abutting the skin surface posteriorly. Disc levels: Unless otherwise stated, the level by level findings below have not significantly changed since prior MRI 09/01/2019. Mild multilevel disc degeneration greatest at L3-L4, L5-S1 and S1-S2. T12-L1: Imaged sagittally. No significant disc herniation or stenosis. L1-L2: Imaged sagittally. No significant disc herniation or stenosis. L2-L3: Grade 1 retrolisthesis. No significant disc herniation or stenosis. L3-L4: Grade 1 retrolisthesis. Disc uncovering with disc bulge. Unchanged superimposed right foraminal disc protrusion. New central posterior annular fissure. Mild facet arthrosis. Mild relative right subarticular and central canal narrowing without nerve root impingement. Mild right inferior neural foraminal narrowing. As before, the disc protrusion may contact the exiting right L3 nerve root. L4-L5: Grade 1 retrolisthesis. Minimal disc bulge. Mild facet arthrosis. Minimal relative bilateral subarticular narrowing without nerve root impingement. Central canal patent. No significant foraminal stenosis. L5-S1: Grade 1 retrolisthesis. Post laminectomy changes, new from the prior MRI. Disc bulge with endplate spurring. Residual/recurrent broad-based central/right subarticular disc protrusion, now with mild caudal migration. Mild facet arthrosis.  New from the prior exam, there is a circumscribed fluid collection within the right extradural spinal canal measuring  1.3 x 0.7 cm (for instance as seen on series 10, images 22 in 23). There is multifactorial severe right subarticular stenosis with potential to affect the descending right S1 nerve root. Multifactorial moderate to moderately severe spinal canal stenosis, progressed. Also new from the prior exam, there has been interval progression of a right foraminal disc protrusion at site of posterior annular fissure which contributes to mild right neural foraminal narrowing and may contact the exiting right L5 nerve root. S1-S2: Left center posterior annular fissure. Minimal disc bulge with endplate spurring. No significant spinal canal or foraminal stenosis. Impressions #2 and #3 below will be called to the ordering clinician or representative by the Radiologist Assistant, and communication documented in the PACS or [employer]. IMPRESSION: Transitional lumbosacral anatomy as described. Spinal numbering will remain consistent with that utilized on the prior lumbar spine MRI of 09/01/2019. At L5-S1, post laminectomy changes are new from the prior MRI. Residual/recurrent broad-based central/right subarticular disc protrusion now with mild caudal migration. A 1.3 x 0.7 cm circumscribed fluid collection within the right extradural spinal canal is also new, and this may reflect a synovial facet cyst or extension of the paraspinal fluid collection described below. The disc protrusion and fluid collection contribute to multifactorial progressive severe right subarticular stenosis with potential to affect the descending right S1 nerve root. There is also progressive multifactorial moderate to moderately severe central canal stenosis. Additionally, there has been interval progression of a right foraminal disc protrusion which contributes to mild right neural foraminal narrowing and may contact the exiting right L5  nerve root. 5.3 x 4.5 x 5.2 cm peripherally enhancing fluid collection within the dorsal soft tissues extending from the L5-S1 laminectomy sites. This is a nonspecific finding. Correlate to exclude any signs or symptoms that would suggest infection/abscess. Additionally correlate for possible CSF leak. At L3-L4, a right foraminal disc protrusion contributes to mild right neural foraminal narrowing and may contact the exiting right L3 nerve root. These findings are unchanged Electronically Signed   By: Jackey Loge DO   On: 02/21/2020 12:29    Assessment/Plan: 55 y.o. female who is post op day #1 s/p exploration of Lumbar surgical wound with  cerebrospinal fluid leak repair. She underwent bilateral L5-S1 foraminotomies on 01/21/2020.  She is doing well and in good spirits. Continue strict bed rest with HOB flat for next two days. May log roll patient. Continue pain management.    LOS: 1 day     Council Mechanic, DNP, NP-C 02/23/2020, 9:55 AM

## 2020-02-24 NOTE — Progress Notes (Signed)
Subjective: Patient reports mild back discomfort that she feels is related to laying flat. No acute events reported overnight.   Objective: Vital signs in last 24 hours: Temp:  [97.5 F (36.4 C)-98.2 F (36.8 C)] 97.5 F (36.4 C) (01/06 0800) Pulse Rate:  [48-60] 55 (01/06 0800) Resp:  [16-18] 16 (01/06 0800) BP: (119-140)/(45-66) 140/50 (01/06 0800) SpO2:  [97 %-100 %] 98 % (01/06 0800)  Intake/Output from previous day: 01/05 0701 - 01/06 0700 In: 2027.2 [P.O.:160; I.V.:1867.2] Out: 1900 [Urine:1900] Intake/Output this shift: No intake/output data recorded.  Physical Exam:   Patient is supine with HOB flat.  Patient is awake, A/O X 4, conversant, and in good spirits. Speech is fluent and appropriate. Doing well. MAEW with good strength that is symmetric bilaterally. 5/5 BUE/BLE.   Dressing is clean dry intact. Incision is well approximated with no drainage, erythema, or edema.    Lab Results: Recent Labs    02/22/20 0554  WBC 9.9  HGB 13.7  HCT 43.5  PLT 314   BMET No results for input(s): NA, K, CL, CO2, GLUCOSE, BUN, CREATININE, CALCIUM in the last 72 hours.  Studies/Results: No results found.  Assessment/Plan: 55 y.o. female who is post op day #2 s/p exploration of Lumbar surgical wound with cerebrospinal fluid leak repair. She underwent bilateral L5-S1 foraminotomies on 01/21/2020.  She is continuing to do well.  Continue strict bed rest with HOB flat for 1 more day. May log roll patient. Continue pain management. Will plan to mobilize patient tomorrow and assess readiness for discharge.    LOS: 2 days     Council Mechanic, DNP, NP-C 02/24/2020, 10:23 AM

## 2020-02-25 MED ORDER — SODIUM CHLORIDE 0.9 % IV BOLUS
500.0000 mL | Freq: Once | INTRAVENOUS | Status: AC
  Administered 2020-02-26: 500 mL via INTRAVENOUS

## 2020-02-25 NOTE — TOC Initial Note (Signed)
Transition of Care [facility]) - Initial/Assessment Note    Patient Details  Name: Jeanne Fisher MRN: [id number] Date of Birth: 05-19-1965  [facility]) CM/SW Contact:    Kermit Balo, RN Phone Number: 02/25/2020, 2:49 PM  Clinical Narrative:                 Plan is for patient to d/c home when medically ready.  Pt has no insurance but has a PCP: Dr Kieth Brightly.  Pt denies issues obtaining home medications.  Pt denies issues with transportation.  Spouse will provide needed supervision and transportation home.  Expected Discharge Plan: Home/Self Care Barriers to Discharge: Continued Medical Work up   Patient Goals and CMS Choice        Expected Discharge Plan and Services Expected Discharge Plan: Home/Self Care       Living arrangements for the past 2 months: Single Family Home                                      Prior Living Arrangements/Services Living arrangements for the past 2 months: Single Family Home Lives with:: Spouse Patient language and need for interpreter reviewed:: Yes Do you feel safe going back to the place where you live?: Yes        Care giver support system in place?: Yes (comment)   Criminal Activity/Legal Involvement Pertinent to Current Situation/Hospitalization: No - Comment as needed  Activities of Daily Living   ADL Screening (condition at time of admission) Patient's cognitive ability adequate to safely complete daily activities?: Yes Patient able to express need for assistance with ADLs?: Yes Independently performs ADLs?: Yes (appropriate for developmental age)  Permission Sought/Granted                  Emotional Assessment Appearance:: Appears stated age Attitude/Demeanor/Rapport: Engaged Affect (typically observed): Accepting Orientation: : Oriented to Self,Oriented to Place,Oriented to  Time,Oriented to Situation   Psych Involvement: No (comment)  Admission diagnosis:  CSF leak [G96.00] Patient Active  Problem List   Diagnosis Date Noted  . 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 FHx: colon cancer 11/03/2013  . Obesity 09/26/2011  . DIVERTICULOSIS OF COLON 02/22/2009  . HYPERCHOLESTEROLEMIA 02/14/2009  . ANXIETY 02/14/2009  . DEPRESSION 02/14/2009  . HEMORRHOIDS 02/14/2009  . 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 GERD 02/14/2009  . HIATAL HERNIA 02/14/2009  . HEMATOCHEZIA 02/14/2009  . Constipation 02/14/2009   PCP:  Maximiano Coss, MD Pharmacy:   [facility] [phone] [place], [place] - [address] AT [facility] [address] [facility] [address] [place] [address] Phone: [phone] Fax: [phone]     Social Determinants of Health (SDOH) Interventions    Readmission Risk Interventions No flowsheet data found.

## 2020-02-25 NOTE — Plan of Care (Signed)

## 2020-02-25 NOTE — Progress Notes (Signed)
Patient ID: Jeanne Fisher, female   DOB: July 29, 1965, 55 y.o.   MRN: [id number] Alert, conversant with HOB at 30degrees. She reports mild headache, stating, "But I think it's because I haven't eaten since Monday... just Dr. Alcus Dad and water".  Incision is flat without evidence of drainage beneath honeycomb. Good strength BLE. MAEW.  Nursing reports pt requested only clear liquids until able to be out of bed.  No BM, but denies discomfort.  Encouraged meal prior to moving to chair. Reevaluate headache after meal. If stable, proceed with mobilizing as planned.

## 2020-02-25 NOTE — Progress Notes (Signed)
Patient had [place] sandwich for lunch, reported headache resolved. This nurse removed patient's foley and assisted her to the bathroom. Patient denied dizziness and pain on standing. Patient had BM and is resting comfortably awaiting discharge

## 2020-02-25 NOTE — Progress Notes (Signed)
Subjective: Patient reports continues to have c/o mild back discomfort due to laying supine with HOB flat.   Objective: Vital signs in last 24 hours: Temp:  [97.3 F (36.3 C)-98.4 F (36.9 C)] 98.1 F (36.7 C) (01/07 0808) Pulse Rate:  [53-72] 72 (01/07 0808) Resp:  [16-18] 18 (01/07 0808) BP: (122-132)/(46-58) 131/49 (01/07 0808) SpO2:  [97 %-99 %] 99 % (01/07 0808)  Intake/Output from previous day: 01/06 0701 - 01/07 0700 In: 556.7 [P.O.:480; I.V.:76.7] Out: 3200 [Urine:3200] Intake/Output this shift: No intake/output data recorded.  Physical Exam: Patient continues to be supine with HOB flat. She is awake, A/O X 4, conversant, and in good spirits. Speech is fluent and appropriate. Doing well. MAEW with good strength that is symmetric bilaterally. 5/5 BUE/BLE. Dressing is clean dry intact. Incision is well approximated with no drainage, erythema, or edema.   Lab Results: No results for input(s): WBC, HGB, HCT, PLT in the last 72 hours. BMET No results for input(s): NA, K, CL, CO2, GLUCOSE, BUN, CREATININE, CALCIUM in the last 72 hours.  Studies/Results: No results found.  Assessment/Plan: 55 y.o. female who is post op day #3 s/p exploration of Lumbar surgical wound with cerebrospinal fluid leak repair. She underwent bilateral L5-S1 foraminotomies on 01/21/2020. She is continuing to do well with her only complaint being mild back discomfort that she feels is due to laying flat.    Continue bedrest with HOB at 30 degrees until 1200 today. If patient tolerates her HOB at 30 degrees, we will get the patient up and remove her foley catheter, and plan for discharge this afternoon as long as she remains stable. Continue pain management.   LOS: 3 days     Council Mechanic, DNP, NP-C 02/25/2020, 9:04 AM

## 2020-02-25 NOTE — Progress Notes (Signed)
Paged Neuro Surgery to inform them that around 1510 pt was on toilet and turned pale in the face and was diaphoretic and slumped over into spouse.  She said she was so sick and weak that she could not make it to the bed.  We got her into a chair and dragged it to the bed and transferred her, put her head flat, and elevated her legs and put on SCDs.  Her bp slightly low at 120/43 and her HR was 75.  Arlys John called back and agreed with what we had done and said she may not be going home today.  Once she was back in the bed she said she felt much better and her color returned in her face.  She was reminded not to get up alone again, as she was in the chair and got self up.  Spouse said she had to go and just got up.  Back in bed with alarm on.

## 2020-02-26 MED ORDER — DOCUSATE SODIUM 100 MG PO CAPS: 100.0000 mg | ORAL_CAPSULE | Freq: Two times a day (BID) | ORAL | 0 refills | Status: AC

## 2020-02-26 MED ORDER — HYDROCODONE-ACETAMINOPHEN 5-325 MG PO TABS: 1.0000 | ORAL_TABLET | ORAL | Status: DC | PRN

## 2020-02-26 MED ORDER — HYDROCODONE-ACETAMINOPHEN 5-325 MG PO TABS: 1.0000 | ORAL_TABLET | ORAL | 0 refills | Status: AC | PRN

## 2020-02-26 MED ORDER — METHOCARBAMOL 500 MG PO TABS
1000.0000 mg | ORAL_TABLET | Freq: Four times a day (QID) | ORAL | 0 refills | Status: AC | PRN
Start: 2020-02-26 — End: ?

## 2020-02-26 NOTE — Discharge Summary (Signed)
Physician Discharge Summary  Patient ID: ALDENA WORM MRN: [id number] DOB/AGE: 57-Jan-1967 57 y.o.  Admit date: 02/22/2020 Discharge date: 02/26/2020  Admission Diagnoses: Pseudomeningocele, CSF leak  Discharge Diagnoses: The same Active Problems:   CSF leak   Discharged Condition: good  Hospital Course: Dr. Venetia Maxon admitted the patient on 02/22/2020 with a CSF leak.  He performed a repair of the CSF leak on that date.  The patient was kept flat for several days after surgery.  She was subsequently mobilized.  On 02/26/2020 the patient felt well.  She had no headaches.  And she requested discharge to home.  She was given written and oral discharge instructions.  All her questions were answered.  Consults: None Significant Diagnostic Studies: None Treatments: Repair of CSF leak/pseudomeningocele Discharge Exam: Blood pressure 112/61, pulse 93, temperature 99.1 F (37.3 C), temperature source Oral, resp. rate 16, height 5\' 7"  (1.702 m), weight 83 kg, SpO2 99 %. The patient is alert and pleasant.  Her strength is normal.  Her dressing is clean and dry.  Disposition: Home  Discharge Instructions    Call MD for:  difficulty breathing, headache or visual disturbances   Complete by: As directed    Call MD for:  extreme fatigue   Complete by: As directed    Call MD for:  hives   Complete by: As directed    Call MD for:  persistant dizziness or light-headedness   Complete by: As directed    Call MD for:  persistant nausea and vomiting   Complete by: As directed    Call MD for:  redness, tenderness, or signs of infection (pain, swelling, redness, odor or green/yellow discharge around incision site)   Complete by: As directed    Call MD for:  severe uncontrolled pain   Complete by: As directed    Call MD for:  temperature >100.4   Complete by: As directed    Diet - low sodium heart healthy   Complete by: As directed    Discharge instructions   Complete by: As directed    Call  [phone] for a followup appointment. Take a stool softener while you are using pain medications.   Driving Restrictions   Complete by: As directed    Do not drive for 2 weeks.   Increase activity slowly   Complete by: As directed    Lifting restrictions   Complete by: As directed    Do not lift more than 5 pounds. No excessive bending or twisting.   May shower / Bathe   Complete by: As directed    Remove the dressing for 3 days after surgery.  You may shower, but leave the incision alone.   No dressing needed   Complete by: As directed      Allergies as of 02/26/2020      Reactions   Other Nausea And Vomiting   Nuts   Oxycodone-acetaminophen Nausea And Vomiting   Darvon [propoxyphene] Nausea And Vomiting      Medication List    TAKE these medications   amitriptyline 25 MG tablet Commonly known as: ELAVIL Take 25 mg by mouth at bedtime.   aspirin 81 MG tablet Take 81 mg by mouth daily.   atorvastatin 40 MG tablet Commonly known as: LIPITOR Take 40 mg by mouth daily.   docusate sodium 100 MG capsule Commonly known as: COLACE Take 1 capsule (100 mg total) by mouth 2 (two) times daily.   HYDROcodone-acetaminophen 5-325 MG tablet Commonly known as: NORCO/VICODIN  Take 1-2 tablets by mouth every 4 (four) hours as needed for severe pain ((score 7 to 10)). What changed:   how much to take  when to take this  reasons to take this   Linzess 145 MCG Caps capsule Generic drug: linaclotide TAKE ONE CAPSULE BY MOUTH EVERY DAY BEFORE BREAKFAST   methocarbamol 500 MG tablet Commonly known as: ROBAXIN Take 2 tablets (1,000 mg total) by mouth every 6 (six) hours as needed for muscle spasms. What changed: when to take this   omeprazole 40 MG capsule Commonly known as: PRILOSEC TAKE 1 CAPSULE(40 MG) BY MOUTH DAILY BEFORE BREAKFAST What changed: See the new instructions.            Discharge Care Instructions  (From admission, onward)         Start     Ordered    02/26/20 0000  No dressing needed        02/26/20 2500           Signed: Cristi Loron 02/26/2020, 9:45 AM

## 2020-02-26 NOTE — Plan of Care (Signed)

## 2020-02-26 NOTE — Progress Notes (Signed)
Pt was up in chair and felt dizzy, nauseated and "hot". No visible diaphoresis observed by nurse. BP 92/60. Moved pt to bed, layed flat and elevated legs. Pt stated she felt better. A few minutes later, she again felt "hot", nauseated and dizzy. On call paged. New order: bolus NS. PT tolerated well and states she feels fine now.

## 2020-05-27 ENCOUNTER — Other Ambulatory Visit: Payer: Self-pay | Admitting: Gastroenterology

## 2020-07-05 ENCOUNTER — Other Ambulatory Visit: Payer: Self-pay | Admitting: Gastroenterology

## 2020-07-07 NOTE — Telephone Encounter (Signed)
Patient has not been seen since 2019.  I am sending in a 56-month supply.  She will need an office visit for additional refills.

## 2020-07-10 NOTE — Telephone Encounter (Signed)
OV made, appt card mailed ?

## 2020-11-15 ENCOUNTER — Encounter: Payer: Self-pay | Admitting: Gastroenterology

## 2020-11-15 NOTE — Progress Notes (Deleted)
Primary Care Physician:  Maximiano Coss, MD Primary Gastroenterologist:  Dr. Jena Gauss  No chief complaint on file.   HPI:   Jeanne Fisher is a 55 y.o. female with GI history significant for GERD, constipation, internal hemorrhoids, and dysphagia in the setting of Schatzki's ring s/p dilation in 2009.  She also has family history significant for colon polyps and colon cancer and is currently overdue for colonoscopy.  Last colonoscopy was in 2015 with noncompliant left colon, otherwise normal exam, recommended repeat in 2020. She is presenting today with chief complaint of ***.  Last seen in our office on 10/21/2017.  Pantoprazole was switched to Dexilant and she was continued on Linzess 145 mcg daily.  Today:  GERD:  Constipation:  Family history of colon polyps/colon cancer:    ASA 2   Past Medical History:  Diagnosis Date   Anxiety    Arthritis    Constipation    Depression    GERD (gastroesophageal reflux disease)    Headache    Ruptured lumbar disc     Past Surgical History:  Procedure Laterality Date   BREAST REDUCTION SURGERY     cesaren section     CHOLECYSTECTOMY     COLONOSCOPY  11/12/2007   Internal hemorrhoids, sigmoid diverticula, otherwise normal.   COLONOSCOPY N/A 11/26/2013   Dr. Jena Gauss: noncompliant left colon otherwise normal colon. next TCS in 11/2018 given FH colon polyps   ESOPHAGOGASTRODUODENOSCOPY  11/12/2007   Subtle Schatzki ring s/p dilation, small hiatal hernia, otherwise normal stomach and D1/D2.   PARTIAL HYSTERECTOMY     REPAIR OF CEREBROSPINAL FLUID LEAK N/A 02/22/2020   Procedure: Exploration of Lumbar surgical wound with  cerebrospinal fluid leak repair;  Surgeon: Maeola Harman, MD;  Location: [facility] OR;  Service: Neurosurgery;  Laterality: N/A;    Current Outpatient Medications  Medication Sig Dispense Refill   amitriptyline (ELAVIL) 25 MG tablet Take 25 mg by mouth at bedtime.     aspirin 81 MG tablet Take 81 mg by mouth daily.      atorvastatin (LIPITOR) 40 MG tablet Take 40 mg by mouth daily.     docusate sodium (COLACE) 100 MG capsule Take 1 capsule (100 mg total) by mouth 2 (two) times daily. 60 capsule 0   HYDROcodone-acetaminophen (NORCO/VICODIN) 5-325 MG tablet Take 1-2 tablets by mouth every 4 (four) hours as needed for severe pain ((score 7 to 10)). 30 tablet 0   LINZESS 145 MCG CAPS capsule TAKE ONE CAPSULE BY MOUTH EVERY DAY BEFORE BREAKFAST (Patient not taking: Reported on 02/21/2020) 90 capsule 3   methocarbamol (ROBAXIN) 500 MG tablet Take 2 tablets (1,000 mg total) by mouth every 6 (six) hours as needed for muscle spasms. 50 tablet 0   omeprazole (PRILOSEC) 40 MG capsule Take 1 capsule (40 mg total) by mouth daily before breakfast. 30 capsule 3   No current facility-administered medications for this visit.    Allergies as of 11/16/2020 - Review Complete 02/22/2020  Allergen Reaction Noted   Other Nausea And Vomiting 02/21/2020   Oxycodone-acetaminophen Nausea And Vomiting 02/21/2020   Darvon [propoxyphene] Nausea And Vomiting 02/21/2020    Family History  Problem Relation Age of Onset   Heart attack Father        died age 46   Colon cancer Maternal Grandmother        age 56, deceased   Colon polyps Mother    Esophageal cancer Maternal Grandfather    Kidney cancer Paternal Grandmother  Social History   Socioeconomic History   Marital status: Married    Spouse name: Not on file   Number of children: 1   Years of education: Not on file   Highest education level: Not on file  Occupational History   Not on file  Tobacco Use   Smoking status: Former    Packs/day: 1.00    Years: 10.00    Pack years: 10.00    Types: Cigarettes    Quit date: 04/03/1997    Years since quitting: 23.6   Smokeless tobacco: Never  Substance and Sexual Activity   Alcohol use: No   Drug use: No   Sexual activity: Not on file  Other Topics Concern   Not on file  Social History Narrative   Not on file    Social Determinants of Health   Financial Resource Strain: Not on file  Food Insecurity: Not on file  Transportation Needs: Not on file  Physical Activity: Not on file  Stress: Not on file  Social Connections: Not on file  Intimate Partner Violence: Not on file    Review of Systems: Gen: Denies any fever, chills, fatigue, weight loss, lack of appetite.  CV: Denies chest pain, heart palpitations, peripheral edema, syncope.  Resp: Denies shortness of breath at rest or with exertion. Denies wheezing or cough.  GI: Denies dysphagia or odynophagia. Denies jaundice, hematemesis, fecal incontinence. GU : Denies urinary burning, urinary frequency, urinary hesitancy MS: Denies joint pain, muscle weakness, cramps, or limitation of movement.  Derm: Denies rash, itching, dry skin Psych: Denies depression, anxiety, memory loss, and confusion Heme: Denies bruising, bleeding, and enlarged lymph nodes.  Physical Exam: There were no vitals taken for this visit. General:   Alert and oriented. Pleasant and cooperative. Well-nourished and well-developed.  Head:  Normocephalic and atraumatic. Eyes:  Without icterus, sclera clear and conjunctiva pink.  Ears:  Normal auditory acuity. Nose:  No deformity, discharge,  or lesions. Mouth:  No deformity or lesions, oral mucosa pink.  Neck:  Supple, without mass or thyromegaly. Lungs:  Clear to auscultation bilaterally. No wheezes, rales, or rhonchi. No distress.  Heart:  S1, S2 present without murmurs appreciated.  Abdomen:  +BS, soft, non-tender and non-distended. No HSM noted. No guarding or rebound. No masses appreciated.  Rectal:  Deferred  Msk:  Symmetrical without gross deformities. Normal posture. Pulses:  Normal pulses noted. Extremities:  Without clubbing or edema. Neurologic:  Alert and  oriented x4;  grossly normal neurologically. Skin:  Intact without significant lesions or rashes. Cervical Nodes:  No significant cervical  adenopathy. Psych:  Alert and cooperative. Normal mood and affect.

## 2020-11-16 ENCOUNTER — Ambulatory Visit: Payer: Self-pay | Admitting: Gastroenterology
